# Patient Record
Sex: Female | Born: 1995 | Race: Black or African American | Hispanic: No | Marital: Single | Smoking: Never smoker
Health system: Southern US, Community
[De-identification: ages and names within clinical notes are randomized; demographics above are authoritative.]

## PROBLEM LIST (undated history)

## (undated) ENCOUNTER — Ambulatory Visit: Payer: Self-pay

## (undated) ENCOUNTER — Emergency Department (HOSPITAL_COMMUNITY): Admission: EM | Payer: Medicaid Other | Source: Home / Self Care

## (undated) DIAGNOSIS — F32A Depression, unspecified: Secondary | ICD-10-CM

## (undated) DIAGNOSIS — F419 Anxiety disorder, unspecified: Secondary | ICD-10-CM

## (undated) DIAGNOSIS — L503 Dermatographic urticaria: Secondary | ICD-10-CM

## (undated) DIAGNOSIS — N926 Irregular menstruation, unspecified: Secondary | ICD-10-CM

## (undated) DIAGNOSIS — D509 Iron deficiency anemia, unspecified: Secondary | ICD-10-CM

## (undated) DIAGNOSIS — L509 Urticaria, unspecified: Secondary | ICD-10-CM

## (undated) HISTORY — PX: FINGER SURGERY: SHX640

## (undated) HISTORY — PX: WISDOM TOOTH EXTRACTION: SHX21

---
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
## 2018-04-10 ENCOUNTER — Other Ambulatory Visit (HOSPITAL_COMMUNITY)
Admission: RE | Admit: 2018-04-10 | Discharge: 2018-04-10 | Disposition: A | Discharge status: Home / Self Care | Payer: BLUE CROSS/BLUE SHIELD | Source: Ambulatory Visit | Attending: Family Medicine | Admitting: Family Medicine

## 2018-04-10 ENCOUNTER — Other Ambulatory Visit: Payer: Self-pay | Admitting: *Deleted

## 2018-04-10 ENCOUNTER — Other Ambulatory Visit: Payer: Self-pay

## 2018-04-10 ENCOUNTER — Ambulatory Visit (INDEPENDENT_AMBULATORY_CARE_PROVIDER_SITE_OTHER): Payer: BLUE CROSS/BLUE SHIELD | Admitting: Family Medicine

## 2018-04-10 ENCOUNTER — Encounter: Payer: Self-pay | Admitting: Family Medicine

## 2018-04-10 VITALS — BP 112/64 | HR 91 | Temp 99.1°F | Resp 16 | Ht 66.0 in | Wt 217.8 lb

## 2018-04-10 DIAGNOSIS — Z0000 Encounter for general adult medical examination without abnormal findings: Secondary | ICD-10-CM | POA: Diagnosis not present

## 2018-04-10 DIAGNOSIS — N926 Irregular menstruation, unspecified: Secondary | ICD-10-CM

## 2018-04-10 DIAGNOSIS — E669 Obesity, unspecified: Secondary | ICD-10-CM

## 2018-04-10 DIAGNOSIS — Z113 Encounter for screening for infections with a predominantly sexual mode of transmission: Secondary | ICD-10-CM | POA: Diagnosis not present

## 2018-04-10 DIAGNOSIS — L700 Acne vulgaris: Secondary | ICD-10-CM

## 2018-04-10 LAB — COMPREHENSIVE METABOLIC PANEL
ALBUMIN: 3.9 g/dL (ref 3.5–5.2)
ALT: 12 U/L (ref 0–35)
AST: 16 U/L (ref 0–37)
Alkaline Phosphatase: 38 U/L — ABNORMAL LOW (ref 39–117)
BILIRUBIN TOTAL: 0.3 mg/dL (ref 0.2–1.2)
BUN: 9 mg/dL (ref 6–23)
CO2: 25 meq/L (ref 19–32)
CREATININE: 0.78 mg/dL (ref 0.40–1.20)
Calcium: 8.7 mg/dL (ref 8.4–10.5)
Chloride: 108 mEq/L (ref 96–112)
GFR: 113.01 mL/min (ref >60.00)
Glucose, Bld: 86 mg/dL (ref 70–99)
Potassium: 3.8 mEq/L (ref 3.5–5.1)
SODIUM: 141 meq/L (ref 135–145)
Total Protein: 5.9 g/dL — ABNORMAL LOW (ref 6.0–8.3)

## 2018-04-10 LAB — CBC WITH DIFFERENTIAL/PLATELET
BASOS ABS: 0 10*3/uL (ref 0.0–0.1)
BASOS PCT: 0.8 % (ref 0.0–3.0)
EOS ABS: 0.1 10*3/uL (ref 0.0–0.7)
EOS PCT: 1.2 % (ref 0.0–5.0)
HEMATOCRIT: 34.5 % — AB (ref 36.0–46.0)
HEMOGLOBIN: 11.4 g/dL — AB (ref 12.0–15.0)
Lymphocytes Relative: 44.2 % (ref 12.0–46.0)
Lymphs Abs: 2.2 10*3/uL (ref 0.7–4.0)
MCHC: 33 g/dL (ref 30.0–36.0)
MCV: 89.3 fl (ref 78.0–100.0)
MONO ABS: 0.3 10*3/uL (ref 0.1–1.0)
Monocytes Relative: 5.7 % (ref 3.0–12.0)
Neutro Abs: 2.4 10*3/uL (ref 1.4–7.7)
Neutrophils Relative %: 48.1 % (ref 43.0–77.0)
Platelets: 295 10*3/uL (ref 150.0–400.0)
RBC: 3.86 Mil/uL — ABNORMAL LOW (ref 3.87–5.11)
RDW: 12.3 % (ref 11.5–14.6)
WBC: 5 10*3/uL (ref 4.5–10.5)

## 2018-04-10 LAB — LIPID PANEL
CHOL/HDL RATIO: 3
Cholesterol: 140 mg/dL (ref 0–200)
HDL: 48.8 mg/dL (ref >39.00)
LDL CALC: 77 mg/dL (ref 0–99)
NonHDL: 91.32
Triglycerides: 74 mg/dL (ref 0.0–149.0)
VLDL: 14.8 mg/dL (ref 0.0–40.0)

## 2018-04-10 LAB — TSH: TSH: 1.69 u[IU]/mL (ref 0.35–5.50)

## 2018-04-10 MED ORDER — ADAPALENE 0.1 % EX CREA: TOPICAL_CREAM | Freq: Every day | CUTANEOUS | 5 refills | Status: DC

## 2018-04-10 NOTE — Progress Notes (Signed)
Subjective  CC:  Chief Complaint  Patient presents with  . Establish Care    She fasting  . Hand Pain    Left hand pinky finger    HPI: Anna Potts is a 23 y.o. female is a former [facility] patient and is here to reestablish care with me today. And for wellness exam   She has the following concerns or needs:  23 yo female who lives with mom and younger sister who currently at [facility].  She is in a monogamous same-sex relationship for the last 8 months.  She is only been with a female partner 1 time in her life.  She has had greater than 5 sexual partners.  She requests STI testing today.  She is asymptomatic.  Irregular menses since young adolescent.  Continues to have irregular periods, typically 1 every other month or so.  No longer with painful menstrual cycles.  She does take iron due to history of anemia.  She admits to mild hirsutism and mild acne.  Obesity: Weight goes up and down.  Tends to eat an unhealthy diet.  Exercises intermittent.  She denies symptoms of hyperglycemia.  Acne: Had been on Differin cream in the past that worked well.  Assessment  1. Annual physical exam   2. Screen for STD (sexually transmitted disease)   3. Irregular menses   4. Acne vulgaris   5. Obesity (BMI 30-39.9)      Plan  Female Wellness Visit:  Age appropriate Health Maintenance and Prevention measures were discussed with patient. Included topics are cancer screening recommendations, ways to keep healthy (see AVS) including dietary and exercise recommendations, regular eye and dental care, use of seat belts, and avoidance of moderate alcohol use and tobacco use.   BMI: discussed patient's BMI and encouraged positive lifestyle modifications to help get to or maintain a target BMI.  HM needs and immunizations were addressed and ordered. See below for orders. See HM and immunization section for updates.  Routine labs and screening tests ordered including cmp, cbc and lipids where  appropriate.  Discussed recommendations regarding Vit D and calcium supplementation (see AVS)  STI screening education done and ordered.  Discussed safe sex.  Irregular menses with hirsutism and acne: Start work-up for PCOS.  Check ultrasound and lab work.  Acne: Start Differin     Follow up:  Return in about 1 year (around 04/11/2019) for complete physical.  Orders Placed This Encounter  Procedures  . US Pelvic Complete With Transvaginal  . CBC with Differential/Platelet  . Comprehensive metabolic panel  . Lipid panel  . HIV Antibody (routine testing w rflx)  . TSH  . FSH/LH   Meds ordered this encounter  Medications  . adapalene (DIFFERIN) 0.1 % cream    Sig: Apply topically at bedtime.    Dispense:  45 g    Refill:  5      We updated and reviewed the patient's past history in detail and it is documented below.  Patient Active Problem List   Diagnosis Date Noted  . Obesity (BMI 30-39.9) 04/10/2018  . Dysfunctional uterine bleeding 11/02/2012    Overview:  Managed with OCPs.   . Acne 12/27/2011    Overview:  Face only; closed comedones   . 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 HIV Screening  06/16/2012  . INFLUENZA VACCINE  09/25/2017  . TETANUS/TDAP  10/18/2018   Immunization  History  Administered Date(s) Administered  . DTaP 08/18/1997, 10/17/1997, 12/28/1997, 01/09/1999, 06/17/2001  . HPV 9-valent 12/07/2013  . Hepatitis A, Ped/Adol-2 Dose 09/25/2005, 09/26/2006  . Hepatitis B, ped/adol 04/06/1997, 08/18/1997, 06/28/1998  . HiB (PRP-OMP) 10/17/1997, 09/25/1998, 01/09/1999  . Influenza, Seasonal, Injecte, Preservative Fre 12/07/2013  . MMR 09/25/1998, 06/17/2001  . Meningococcal Conjugate 01/06/2012, 10/05/2013  . OPV 08/18/1997, 10/17/1997, 01/09/1999, 06/17/2001  . Pneumococcal-Unspecified 01/09/1999, 06/23/1999  . Td 10/17/2008  . Tdap 10/17/2008  . Varicella 06/17/2001, 10/31/2009    No outpatient medications have been marked as taking for the 04/10/18 encounter (Office Visit) with Leamon Arnt, MD.    Allergies: Patient is allergic to codeine; grassleaf sweetflag rhizome; and nickel. Past Medical History Patient  has no past medical history on file. Past Surgical History Patient  has a past surgical history that includes Wisdom tooth extraction. Family History: Patient family history is not on file. Social History:  Patient  reports that she has never smoked. She has never used smokeless tobacco. She reports current drug use. Drug: Marijuana. She reports that she does not drink alcohol.  Review of Systems: Constitutional: negative for fever or malaise Ophthalmic: negative for photophobia, double vision or loss of vision Cardiovascular: negative for chest pain, dyspnea on exertion, or new LE swelling Respiratory: negative for SOB or persistent cough Gastrointestinal: negative for abdominal pain, change in bowel habits or melena Genitourinary: negative for dysuria or gross hematuria Musculoskeletal: negative for new gait disturbance or muscular weakness Integumentary: negative for new or persistent rashes Neurological: negative for TIA or stroke symptoms Psychiatric: negative for SI or delusions Allergic/Immunologic: negative for hives  Patient Care Team    Relationship Specialty Notifications Start End  Leamon Arnt, MD PCP - General Family Medicine  04/10/18     Objective  Vitals: BP 112/64   Pulse 91   Temp 99.1 F (37.3 C) (Oral)   Resp 16   Ht '5\' 6"'  (1.676 m)   Wt 217 lb 12.8 oz (98.8 kg)   LMP 04/08/2018   SpO2 98%   BMI 35.15 kg/m  General:  Well developed, well nourished, no acute distress  Psych:  Alert and oriented,normal mood and affect HEENT:  Normocephalic, atraumatic, non-icteric sclera, PERRL, oropharynx is without mass or exudate, supple neck without adenopathy, mass or thyromegaly Cardiovascular:  RRR without gallop, rub or  murmur, nondisplaced PMI Respiratory:  Good breath sounds bilaterally, CTAB with normal respiratory effort Gastrointestinal: normal bowel sounds, soft, non-tender, no noted masses. No HSM MSK: no deformities, contusions. Joints are without erythema or swelling Skin:  Warm, no rashes or suspicious lesions noted, mild acne on forehead and T-zone.  Mild hirsutism lower abdomen Neurologic:    Mental status is normal. Gross motor and sensory exams are normal. Normal gait  Commons side effects, risks, benefits, and alternatives for medications and treatment plan prescribed today were discussed, and the patient expressed understanding of the given instructions. Patient is instructed to call or message via MyChart if he/she has any questions or concerns regarding our treatment plan. No barriers to understanding were identified. We discussed Red Flag symptoms and signs in detail. Patient expressed understanding regarding what to do in case of urgent or emergency type symptoms.   Medication list was reconciled, printed and provided to the patient in AVS. Patient instructions and summary information was reviewed with the patient as documented in the AVS. This note was prepared with assistance of Dragon voice recognition software. Occasional wrong-word or sound-a-like substitutions may have occurred  due to the inherent limitations of voice recognition software

## 2018-04-10 NOTE — Patient Instructions (Addendum)
It was so good seeing you again! Thank you for establishing with my new practice and allowing me to continue caring for you. It means a lot to me.   I will release your lab results to you on your MyChart account with further instructions. Please reply with any questions.   We will set up an ultrasound to look at your ovaries and check blood work to see if you have PCOS as we discussed.   Please schedule a follow up appointment with me in 12 months for your annual complete physical; please come fasting.   Fat and Cholesterol Restricted Eating Plan Getting too much fat and cholesterol in your diet may cause health problems. Choosing the right foods helps keep your fat and cholesterol at normal levels. This can keep you from getting certain diseases. Your doctor may recommend an eating plan that includes:  Total fat: ______% or less of total calories a day.  Saturated fat: ______% or less of total calories a day.  Cholesterol: less than _________mg a day.  Fiber: ______g a day. What are tips for following this plan? Meal planning  At meals, divide your plate into four equal parts: ? Fill one-half of your plate with vegetables and green salads. ? Fill one-fourth of your plate with whole grains. ? Fill one-fourth of your plate with low-fat (lean) protein foods.  Eat fish that is high in omega-3 fats at least two times a week. This includes mackerel, tuna, sardines, and salmon.  Eat foods that are high in fiber, such as whole grains, beans, apples, broccoli, carrots, peas, and barley. General tips   Work with your doctor to lose weight if you need to.  Avoid: ? Foods with added sugar. ? Fried foods. ? Foods with partially hydrogenated oils.  Limit alcohol intake to no more than 1 drink a day for nonpregnant women and 2 drinks a day for men. One drink equals 12 oz of beer, 5 oz of wine, or 1 oz of hard liquor. Reading food labels  Check food labels for: ? Trans  fats. ? Partially hydrogenated oils. ? Saturated fat (g) in each serving. ? Cholesterol (mg) in each serving. ? Fiber (g) in each serving.  Choose foods with healthy fats, such as: ? Monounsaturated fats. ? Polyunsaturated fats. ? Omega-3 fats.  Choose grain products that have whole grains. Look for the word "whole" as the first word in the ingredient list. Cooking  Cook foods using low-fat methods. These include baking, boiling, grilling, and broiling.  Eat more home-cooked foods. Eat at restaurants and buffets less often.  Avoid cooking using saturated fats, such as butter, cream, palm oil, palm kernel oil, and coconut oil. Recommended foods  Fruits  All fresh, canned (in natural juice), or frozen fruits. Vegetables  Fresh or frozen vegetables (raw, steamed, roasted, or grilled). Green salads. Grains  Whole grains, such as whole wheat or whole grain breads, crackers, cereals, and pasta. Unsweetened oatmeal, bulgur, barley, quinoa, or Warning rice. Corn or whole wheat flour tortillas. Meats and other protein foods  Ground beef (85% or leaner), grass-fed beef, or beef trimmed of fat. Skinless chicken or [place]. Ground chicken or [place]. Pork trimmed of fat. All fish and seafood. Egg whites. Dried beans, peas, or lentils. Unsalted nuts or seeds. Unsalted canned beans. Nut butters without added sugar or oil. Dairy  Low-fat or nonfat dairy products, such as skim or 1% milk, 2% or reduced-fat cheeses, low-fat and fat-free ricotta or cottage cheese, or plain low-fat  and nonfat yogurt. Fats and oils  Tub margarine without trans fats. Light or reduced-fat mayonnaise and salad dressings. Avocado. Olive, canola, sesame, or safflower oils. The items listed above may not be a complete list of foods and beverages you can eat. Contact a dietitian for more information. Foods to avoid Fruits  Canned fruit in heavy syrup. Fruit in cream or butter sauce. Fried fruit. Vegetables  Vegetables  cooked in cheese, cream, or butter sauce. Fried vegetables. Grains  White bread. White pasta. White rice. Cornbread. Bagels, pastries, and croissants. Crackers and snack foods that contain trans fat and hydrogenated oils. Meats and other protein foods  Fatty cuts of meat. Ribs, chicken wings, bacon, sausage, bologna, salami, chitterlings, fatback, hot dogs, bratwurst, and packaged lunch meats. Liver and organ meats. Whole eggs and egg yolks. Chicken and [place] with skin. Fried meat. Dairy  Whole or 2% milk, cream, half-and-half, and cream cheese. Whole milk cheeses. Whole-fat or sweetened yogurt. Full-fat cheeses. Nondairy creamers and whipped toppings. Processed cheese, cheese spreads, and cheese curds. Beverages  Alcohol. Sugar-sweetened drinks such as sodas, lemonade, and fruit drinks. Fats and oils  Butter, stick margarine, lard, shortening, ghee, or bacon fat. Coconut, palm kernel, and palm oils. Sweets and desserts  Corn syrup, sugars, honey, and molasses. Candy. Jam and jelly. Syrup. Sweetened cereals. Cookies, pies, cakes, donuts, muffins, and ice cream. The items listed above may not be a complete list of foods and beverages you should avoid. Contact a dietitian for more information. Summary  Choosing the right foods helps keep your fat and cholesterol at normal levels. This can keep you from getting certain diseases.  At meals, fill one-half of your plate with vegetables and green salads.  Eat high-fiber foods, like whole grains, beans, apples, carrots, peas, and barley.  Limit added sugar, saturated fats, alcohol, and fried foods. This information is not intended to replace advice given to you by your health care provider. Make sure you discuss any questions you have with your health care provider. Document Released: 08/13/2011 Document Revised: 10/15/2017 Document Reviewed: 10/29/2016 Elsevier Interactive Patient Education  2019 [employer].   Please do these things to  maintain good health!   Exercise at least 30-45 minutes a day,  4-5 days a week.   Eat a low-fat diet with lots of fruits and vegetables, up to 7-9 servings per day.  Drink plenty of water daily. Try to drink 8 8oz glasses per day.  Seatbelts can save your life. Always wear your seatbelt.  Place Smoke Detectors on every level of your home and check batteries every year.  Schedule an appointment with an eye doctor for an eye exam every 1-2 years  Safe sex - use condoms to protect yourself from STDs if you could be exposed to these types of infections. Use birth control if you do not want to become pregnant and are sexually active.  Avoid heavy alcohol use. If you drink, keep it to less than 2 drinks/day and not every day.  Health Care Power of Attorney.  Choose someone you trust that could speak for you if you became unable to speak for yourself.  Depression is common in our stressful world.If you're feeling down or losing interest in things you normally enjoy, please come in for a visit.  If anyone is threatening or hurting you, please get help. Physical or Emotional Violence is never OK.

## 2018-04-11 LAB — FSH/LH
FSH: 6.2 m[IU]/mL
LH: 2.1 m[IU]/mL

## 2018-04-11 LAB — HIV ANTIBODY (ROUTINE TESTING W REFLEX): HIV: NONREACTIVE

## 2018-04-13 LAB — URINE CYTOLOGY ANCILLARY ONLY
Chlamydia: NEGATIVE
NEISSERIA GONORRHEA: NEGATIVE

## 2018-07-17 ENCOUNTER — Ambulatory Visit (INDEPENDENT_AMBULATORY_CARE_PROVIDER_SITE_OTHER): Payer: BLUE CROSS/BLUE SHIELD | Admitting: Family Medicine

## 2018-07-17 ENCOUNTER — Encounter: Payer: Self-pay | Admitting: Family Medicine

## 2018-07-17 ENCOUNTER — Other Ambulatory Visit: Payer: Self-pay

## 2018-07-17 ENCOUNTER — Ambulatory Visit: Payer: Self-pay | Admitting: *Deleted

## 2018-07-17 ENCOUNTER — Encounter: Payer: BLUE CROSS/BLUE SHIELD | Admitting: Family Medicine

## 2018-07-17 VITALS — Temp 98.7°F

## 2018-07-17 DIAGNOSIS — L509 Urticaria, unspecified: Secondary | ICD-10-CM

## 2018-07-17 NOTE — Progress Notes (Signed)
     Virtual Visit via Video Note  Subjective  CC:  Chief Complaint  Patient presents with  . Rash/Hives    Started about a month, comes and goes.. She states that it is in varies areas.. She uses benadryl and itch cream with minimal relief.. She can not tell what triggers and has made changes to soap     I connected with Jefm Petty on 07/17/18 at  3:40 PM EDT by a video enabled telemedicine application and verified that I am speaking with the correct person using two identifiers. Location patient: Home Location provider: [facility] [address] Office Persons participating in the virtual visit: Anna Potts, Willow Ora, MD Rita Ohara, CMA  I discussed the limitations of evaluation and management by telemedicine and the availability of in person appointments. The patient expressed understanding and agreed to proceed. HPI: Anna Potts is a 23 y.o. female who was contacted today to address the problems listed above in the chief complaint. . Happy and healthy but has hives; started about a month ago ... come and go but typically present almost daily now. No other sxs. No angioedema. No blisters. + itching. No f/c/s, ST or GI sxs. No h/o allergies or hives. No SOB.  Assessment  1. Urticaria      Plan   urticaria:  Hard to see on video; will treat with zyrtec and pepcid; if not improving in one week, OV in office for better look.  I discussed the assessment and treatment plan with the patient. The patient was provided an opportunity to ask questions and all were answered. The patient agreed with the plan and demonstrated an understanding of the instructions.   The patient was advised to call back or seek an in-person evaluation if the symptoms worsen or if the condition fails to improve as anticipated. Follow up: No follow-ups on file.  Visit date not found  No orders of the defined types were placed in this encounter.     I reviewed the patients updated  PMH, FH, and SocHx.    Patient Active Problem List   Diagnosis Date Noted  . Obesity (BMI 30-39.9) 04/10/2018  . 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 Acne 12/27/2011  . 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 adapalene (DIFFERIN) 0.1 % cream Apply topically at bedtime.  . ferrous sulfate (FERROUSUL) 325 (65 FE) MG tablet Take 1 tablet (325 mg total) by mouth 2 (two) times daily.    Allergies: Patient is allergic to codeine; grassleaf sweetflag rhizome; and nickel. Family History: Patient family history is not on file. Social History:  Patient  reports that she has never smoked. She has never used smokeless tobacco. She reports current drug use. Drug: Marijuana. She reports that she does not drink alcohol.  Review of Systems: Constitutional: Negative for fever malaise or anorexia Cardiovascular: negative for chest pain Respiratory: negative for SOB or persistent cough Gastrointestinal: negative for abdominal pain  OBJECTIVE Vitals: Temp 98.7 F (37.1 C) (Oral)   LMP  (Within Months)  General: no acute distress , A&Ox3 Possible pointing to hives on legs. Hard to tell. No flaking or blistering or papules visualized Willow Ora, MD

## 2018-07-17 NOTE — Patient Instructions (Addendum)
Start using generic pepcid and zyrtec daily and then come to the office if it is not improving.  Hives Hives (urticaria) are itchy, red, swollen areas on the skin. Hives can appear on any part of the body. Hives often fade within 24 hours (acute hives). Sometimes, new hives appear after old ones fade and the cycle can continue for several days or weeks (chronic hives). Hives do not spread from person to person (are not contagious). Hives come from the body's reaction to something a person is allergic to (allergen), something that causes irritation, or various other triggers. When a person is exposed to a trigger, his or her body releases a chemical (histamine) that causes redness, itching, and swelling. Hives can appear right after exposure to a trigger or hours later. What are the causes? This condition may be caused by:  Allergies to foods or ingredients.  Insect bites or stings.  Exposure to pollen or pets.  Contact with latex or chemicals.  Spending time in sunlight, heat, or cold (exposure).  Exercise.  Stress.  Certain medicines. You can also get hives from other medical conditions and treatments, such as:  Viruses, including the common cold.  Bacterial infections, such as urinary tract infections and strep throat.  Certain medicines.  Allergy shots.  Blood transfusions. Sometimes, the cause of this condition is not known (idiopathic hives). What increases the risk? You are more likely to develop this condition if you:  Are a woman.  Have food allergies, especially to citrus fruits, milk, eggs, peanuts, tree nuts, or shellfish.  Are allergic to: ? Medicines. ? Latex. ? Insects. ? Animals. ? Pollen. What are the signs or symptoms? Common symptoms of this condition include raised, itchy, red or white bumps or patches on your skin. These areas may:  Become large and swollen (welts).  Change in shape and location, quickly and repeatedly.  Be separate hives or  connect over a large area of skin.  Sting or become painful.  Turn white when pressed in the center (blanch). In severe cases, yourhands, feet, and face may also become swollen. This may occur if hives develop deeper in your skin. How is this diagnosed? This condition may be diagnosed by your symptoms, medical history, and physical exam.  Your skin, urine, or blood may be tested to find out what is causing your hives and to rule out other health issues.  Your health care provider may also remove a small sample of skin from the affected area and examine it under a microscope (biopsy). How is this treated? Treatment for this condition depends on the cause and severity of your symptoms. Your health care provider may recommend using cool, wet cloths (cool compresses) or taking cool showers to relieve itching. Treatment may include:  Medicines that help: ? Relieve itching (antihistamines). ? Reduce swelling (corticosteroids). ? Treat infection (antibiotics).  An injectable medicine (omalizumab). Your health care provider may prescribe this if you have chronic idiopathic hives and you continue to have symptoms even after treatment with antihistamines. Severe cases may require an emergency injection of adrenaline (epinephrine) to prevent a life-threatening allergic reaction (anaphylaxis). Follow these instructions at home: Medicines  Take and apply over-the-counter and prescription medicines only as told by your health care provider.  If you were prescribed an antibiotic medicine, take it as told by your health care provider. Do not stop using the antibiotic even if you start to feel better. Skin care  Apply cool compresses to the affected areas.  Do not  scratch or rub your skin. General instructions  Do not take hot showers or baths. This can make itching worse.  Do not wear tight-fitting clothing.  Use sunscreen and wear protective clothing when you are outside.  Avoid any  substances that cause your hives. Keep a journal to help track what causes your hives. Write down: ? What medicines you take. ? What you eat and drink. ? What products you use on your skin.  Keep all follow-up visits as told by your health care provider. This is important. Contact a health care provider if:  Your symptoms are not controlled with medicine.  Your joints are painful or swollen. Get help right away if:  You have a fever.  You have pain in your abdomen.  Your tongue or lips are swollen.  Your eyelids are swollen.  Your chest or throat feels tight.  You have trouble breathing or swallowing. These symptoms may represent a serious problem that is an emergency. Do not wait to see if the symptoms will go away. Get medical help right away. Call your local emergency services (911 in the [place] Do not drive yourself to the hospital. Summary  Hives (urticaria) are itchy, red, swollen areas on your skin. Hives come from the body's reaction to something a person is allergic to (allergen), something that causes irritation, or various other triggers.  Treatment for this condition depends on the cause and severity of your symptoms.  Avoid any substances that cause your hives. Keep a journal to help track what causes your hives.  Take and apply over-the-counter and prescription medicines only as told by your health care provider.  Keep all follow-up visits as told by your health care provider. This is important. This information is not intended to replace advice given to you by your health care provider. Make sure you discuss any questions you have with your health care provider. Document Released: 02/11/2005 Document Revised: 08/27/2017 Document Reviewed: 08/27/2017 Elsevier Interactive Patient Education  2019 [employer].

## 2018-07-17 NOTE — Telephone Encounter (Signed)
Please call to schedule appointment.

## 2018-07-17 NOTE — Telephone Encounter (Signed)
Patient states she started with hives on her back about 1 month ago- patient states she has been using over the counter treatment: Benadryl and creams- but the hives have spread to all over the body.  Patient states the hives will go away and come back- she states the itching is bad- hives may go away with shower or treatment- but them they return. Call to office for appointment.  Reason for Disposition . [1] MODERATE-SEVERE hives persist (i.e., hives interfere with normal activities or work) AND [2] taking antihistamine (e.g., Benadryl, Claritin) > 24 hours  Answer Assessment - Initial Assessment Questions 1. APPEARANCE: "What does the rash look like?"      Red, raised, small bumps 2. LOCATION: "Where is the rash located?"      Back, chest,arms, legs 3. NUMBER: "How many hives are there?"      Blocks- comes and goes- can't count 4. SIZE: "How big are the hives?" (inches, cm, compare to coins) "Do they all look the same or is there lots of variation in shape and size?"      Large areas- look the same- many be different sizes depending on area of body they are on 5. ONSET: "When did the hives begin?" (Hours or days ago)      1 month 6. ITCHING: "Does it itch?" If so, ask: "How bad is the itch?"    - MILD: doesn't interfere with normal activities   - MODERATE - SEVERE: interferes with work, school, sleep, or other activities      severe 7. RECURRENT PROBLEM: "Have you had hives before?" If so, ask: "When was the last time?" and "What happened that time?"      no 8. TRIGGERS: "Were you exposed to any new food, plant, cosmetic product or animal just before the hives began?"     Patient has been trying to figure this out- but she can't figure it out 9. OTHER SYMPTOMS: "Do you have any other symptoms?" (e.g., fever, tongue swelling, difficulty breathing, abdominal pain)     no 10. PREGNANCY: "Is there any chance you are pregnant?" "When was your last menstrual period?"       No- LMP-  irregular- not active with female  Protocols used: HIVES-A-AH

## 2018-07-17 NOTE — Telephone Encounter (Signed)
Patient been scheduled for 07/17/18 @ 1pm.

## 2018-07-29 ENCOUNTER — Other Ambulatory Visit: Payer: Self-pay

## 2018-07-29 ENCOUNTER — Ambulatory Visit (INDEPENDENT_AMBULATORY_CARE_PROVIDER_SITE_OTHER): Payer: BLUE CROSS/BLUE SHIELD | Admitting: Family Medicine

## 2018-07-29 ENCOUNTER — Encounter: Payer: Self-pay | Admitting: Family Medicine

## 2018-07-29 VITALS — BP 120/65 | HR 73 | Temp 98.0°F | Ht 65.0 in | Wt 228.0 lb

## 2018-07-29 DIAGNOSIS — L503 Dermatographic urticaria: Secondary | ICD-10-CM

## 2018-07-29 MED ORDER — HYDROXYZINE HCL 25 MG PO TABS: 25.0000 mg | ORAL_TABLET | Freq: Three times a day (TID) | ORAL | 2 refills | Status: DC | PRN

## 2018-07-29 MED ORDER — FAMOTIDINE 20 MG PO TABS: 20.0000 mg | ORAL_TABLET | Freq: Two times a day (BID) | ORAL | 5 refills | Status: DC

## 2018-07-29 MED ORDER — CETIRIZINE HCL 10 MG PO TABS: 10.0000 mg | ORAL_TABLET | Freq: Every day | ORAL | 11 refills | Status: DC

## 2018-07-29 NOTE — Patient Instructions (Addendum)
Please return in February for your physical and pap smear.   If you have any questions or concerns, please don't hesitate to send me a message via MyChart or call the office at [phone]. Thank you for visiting with [place] today! It's our pleasure caring for you.  Look up dermatographism on WebMD

## 2018-07-29 NOTE — Progress Notes (Signed)
Subjective  CC:  Chief Complaint  Patient presents with  . Rash    body     HPI: Anna Potts is a 23 y.o. female who presents to the office today to address the problems listed above in the chief complaint.  See last virtual visit. Started zyrtec. Reports any times she scratches or rubs her skin, she gets "hives". Then she itches. They then disappear. No other associated sxs.   Assessment  1. 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 rec zyrtec, hydroxyzine and pepcid bid. Should start to improve over the next 2-4 weeks. She will let me know if she needs more help. Avoid scratching and rubbing skin. No tattooing now.   Follow up: Return in about 8 months (around 03/31/2019) for complete physical.  Visit date not found  No orders of the defined types were placed in this encounter.  Meds ordered this encounter  Medications  . famotidine (PEPCID) 20 MG tablet    Sig: Take 1 tablet (20 mg total) by mouth 2 (two) times daily.    Dispense:  60 tablet    Refill:  5  . hydrOXYzine (ATARAX/VISTARIL) 25 MG tablet    Sig: Take 1 tablet (25 mg total) by mouth 3 (three) times daily as needed.    Dispense:  60 tablet    Refill:  2  . cetirizine (ZYRTEC) 10 MG tablet    Sig: Take 1 tablet (10 mg total) by mouth daily.    Dispense:  30 tablet    Refill:  11      I reviewed the patients updated PMH, FH, and SocHx.    Patient Active Problem List   Diagnosis Date Noted  . Obesity (BMI 30-39.9) 04/10/2018  . 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 Acne 12/27/2011  . 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    Allergies: Patient is allergic to codeine; grassleaf sweetflag rhizome; and nickel. Family History: Patient family history is not on file. Social History:  Patient  reports that she has never smoked. She has  never used smokeless tobacco. She reports current drug use. Drug: Marijuana. She reports that she does not drink alcohol.  Review of Systems: Constitutional: Negative for fever malaise or anorexia Cardiovascular: negative for chest pain Respiratory: negative for SOB or persistent cough Gastrointestinal: negative for abdominal pain  Objective  Vitals: BP 120/65 (BP Location: Right Arm, Patient Position: Sitting, Cuff Size: Normal)   Pulse 73   Temp 98 F (36.7 C)   Ht 5\' 5"  (1.651 m)   Wt 228 lb (103.4 kg)   LMP 04/28/2018 (Exact Date)   SpO2 98%   BMI 37.94 kg/m  General: no acute distress , A&Ox3 Skin:  Warm, bilateral forearms: linear scratch with rapid appearance of wheel and flare reaction     Commons side effects, risks, benefits, and alternatives for medications and treatment plan prescribed today were discussed, and the patient expressed understanding of the given instructions. Patient is instructed to call or message via MyChart if he/she has any questions or concerns regarding our treatment plan. No barriers to understanding were identified. We discussed Red Flag symptoms and signs in detail. Patient expressed understanding regarding what to do in case of urgent or emergency type symptoms.   Medication list was reconciled, printed and provided to the patient in AVS. Patient instructions  and summary information was reviewed with the patient as documented in the AVS. This note was prepared with assistance of Dragon voice recognition software. Occasional wrong-word or sound-a-like substitutions may have occurred due to the inherent limitations of voice recognition software

## 2019-01-29 ENCOUNTER — Other Ambulatory Visit: Payer: Self-pay

## 2019-01-29 ENCOUNTER — Encounter (HOSPITAL_COMMUNITY): Payer: Self-pay | Admitting: *Deleted

## 2019-01-29 ENCOUNTER — Emergency Department (HOSPITAL_COMMUNITY)
Admission: EM | Admit: 2019-01-29 | Discharge: 2019-01-29 | Disposition: A | Discharge status: Home / Self Care | Payer: BC Managed Care – PPO | Attending: Emergency Medicine | Admitting: Emergency Medicine

## 2019-01-29 DIAGNOSIS — Y999 Unspecified external cause status: Secondary | ICD-10-CM | POA: Insufficient documentation

## 2019-01-29 DIAGNOSIS — Z79899 Other long term (current) drug therapy: Secondary | ICD-10-CM | POA: Diagnosis not present

## 2019-01-29 DIAGNOSIS — W260XXA Contact with knife, initial encounter: Secondary | ICD-10-CM | POA: Diagnosis not present

## 2019-01-29 DIAGNOSIS — Z23 Encounter for immunization: Secondary | ICD-10-CM | POA: Insufficient documentation

## 2019-01-29 DIAGNOSIS — Y9389 Activity, other specified: Secondary | ICD-10-CM | POA: Diagnosis not present

## 2019-01-29 DIAGNOSIS — Y92810 Car as the place of occurrence of the external cause: Secondary | ICD-10-CM | POA: Diagnosis not present

## 2019-01-29 DIAGNOSIS — S81811A Laceration without foreign body, right lower leg, initial encounter: Secondary | ICD-10-CM

## 2019-01-29 MED ORDER — LIDOCAINE-EPINEPHRINE (PF) 2 %-1:200000 IJ SOLN
10.0000 mL | Freq: Once | INTRAMUSCULAR | Status: AC
  Administered 2019-01-29: 10 mL
  Filled 2019-01-29: qty 20

## 2019-01-29 MED ORDER — HYDROXYZINE HCL 25 MG PO TABS
25.0000 mg | ORAL_TABLET | Freq: Once | ORAL | Status: AC
  Administered 2019-01-29: 25 mg via ORAL
  Filled 2019-01-29: qty 1

## 2019-01-29 MED ORDER — TETANUS-DIPHTH-ACELL PERTUSSIS 5-2.5-18.5 LF-MCG/0.5 IM SUSP
0.5000 mL | Freq: Once | INTRAMUSCULAR | Status: AC
  Administered 2019-01-29: 0.5 mL via INTRAMUSCULAR
  Filled 2019-01-29: qty 0.5

## 2019-01-29 NOTE — ED Triage Notes (Signed)
Pt with laceration to the right anterior thigh, says she was using a box cutter and cut her leg while at work. Unknown last tetanus shot

## 2019-01-29 NOTE — Discharge Instructions (Signed)
1. Medications: Tylenol or ibuprofen for pain 2. Treatment: ice for swelling, keep wound clean with warm soap and water and keep bandage dry, do not submerge in water for 24 hours 3. Follow Up: Please return in 10 days to have your stitches removed or sooner if you have concerns. Return to the emergency department for increased redness, drainage of pus from the wound   WOUND CARE  Remove bandage and wash wound gently with mild soap and warm water daily. Reapply a new bandage after cleaning wound.   Continue daily cleansing with soap and water until stitches are removed.  Do not apply any ointments or creams to the wound while stitches are in place, as this may cause delayed healing. Return if you experience any of the following signs of infection: Swelling, redness, pus drainage, streaking, fever >101.0 F  Return if you experience excessive bleeding that does not stop after 15-20 minutes of constant, firm pressure.

## 2019-01-29 NOTE — ED Notes (Signed)
Pt has 1 inch lac to right upper anterior thigh that occurred when using a box cutter last night. Accidental.

## 2019-01-29 NOTE — ED Provider Notes (Signed)
[facility] Provider Note   CSN: [id number] Arrival date & time: 01/29/19  0449     History   Chief Complaint Chief Complaint  Patient presents with  . Extremity Laceration    HPI Anna Potts is a 23 y.o. female presenting for evaluation of right leg laceration.  Patient states around 330 this morning she was opening something in her car with a box cutter when her hand slipped, she caught her anterior right thigh.  Patient reports a mild discomfort at the site of the laceration, but no pain elsewhere.  She denies injury elsewhere.  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  She is not on blood thinners.  Her tetanus shot is not up-to-date.  She denies numbness or tingling.  She has been able to ambulate with mild discomfort, but no weakness.     HPI  History reviewed. No pertinent past medical history.  Patient Active Problem List   Diagnosis Date Noted  . Obesity (BMI 30-39.9) 04/10/2018  . 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 Acne 12/27/2011  . 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 WISDOM TOOTH EXTRACTION       OB History    Gravida  0   Para      Term      Preterm      AB      Living        SAB      TAB      Ectopic      Multiple      Live Births               Home Medications    Prior to Admission medications   Medication Sig Start Date End Date Taking? Authorizing Provider  adapalene (DIFFERIN) 0.1 % cream Apply topically at bedtime. 04/10/18   Leamon Arnt, MD  cetirizine (ZYRTEC) 10 MG tablet Take 1 tablet (10 mg total) by mouth daily. 07/29/18   Leamon Arnt, MD  famotidine (PEPCID) 20 MG tablet Take 1 tablet (20 mg total) by mouth 2 (two) times daily. 07/29/18   Leamon Arnt, MD  ferrous sulfate (FERROUSUL) 325 (65 FE) MG tablet Take 1 tablet (325 mg total) by mouth 2 (two) times  daily. 10/25/12   Leftwich-Kirby, Kathie Dike, CNM  hydrOXYzine (ATARAX/VISTARIL) 25 MG tablet Take 1 tablet (25 mg total) by mouth 3 (three) times daily as needed. 07/29/18   Leamon Arnt, MD    Family History No family history on file.  Social History Social History   Tobacco Use  . Smoking status: Never Smoker  . Smokeless tobacco: Never Used  Substance Use Topics  . Alcohol use: No  . Drug use: Yes    Types: Marijuana     Allergies   Codeine, Grassleaf sweetflag rhizome, and Nickel   Review of Systems Review of Systems  Skin: Positive for wound.  Neurological: Negative for weakness and numbness.  Hematological: Does not bruise/bleed easily.     Physical Exam Updated Vital Signs BP (!) 148/78 (BP Location: Right Arm)   Pulse 79   Temp 98.6 F (37 C) (Oral)   Resp 15   SpO2 100%   Physical Exam Vitals signs and nursing note reviewed.  Constitutional:      General: She is not in acute distress.    Appearance: She is well-developed.  HENT:  Head: Normocephalic and atraumatic.  Neck:     Musculoskeletal: Normal range of motion.  Pulmonary:     Effort: Pulmonary effort is normal.  Abdominal:     General: There is no distension.  Musculoskeletal: Normal range of motion.  Skin:    General: Skin is warm.     Capillary Refill: Capillary refill takes less than 2 seconds.     Findings: No rash.     Comments: 2 cm lack to the anterior right thigh without active bleeding.  Approximately 1 cm deep.  No fascial or muscular involvement.  Strength of right extremity 5+.  Ambulatory with out difficulty.  Pedal pulses intact.  No distal numbness.  Good distal cap refill.  Neurological:     Mental Status: She is alert and oriented to person, place, and time.      ED Treatments / Results  Labs (all labs ordered are listed, but only abnormal results are displayed) Labs Reviewed - No data to display  EKG None  Radiology No results found.  Procedures  .Marland KitchenLaceration Repair  Date/Time: 01/29/2019 9:11 AM Performed by: Alveria Apley, PA-C Authorized by: Alveria Apley, PA-C   Consent:    Consent obtained:  Verbal   Consent given by:  Patient   Risks discussed:  Infection, need for additional repair, nerve damage, poor wound healing, poor cosmetic result, pain, retained foreign body, tendon damage and vascular damage Anesthesia (see MAR for exact dosages):    Anesthesia method:  Local infiltration   Local anesthetic:  Lidocaine 2% WITH epi Laceration details:    Location:  Leg   Leg location:  R upper leg   Length (cm):  2   Depth (mm):  10 Pre-procedure details:    Preparation:  Patient was prepped and draped in usual sterile fashion Exploration:    Wound exploration: wound explored through full range of motion and entire depth of wound probed and visualized     Wound extent: no muscle damage noted, no nerve damage noted, no tendon damage noted and no vascular damage noted   Treatment:    Area cleansed with:  Saline   Amount of cleaning:  Standard   Irrigation solution:  Sterile water   Irrigation method:  Syringe Skin repair:    Repair method:  Sutures   Suture size:  4-0   Suture material:  Prolene   Suture technique:  Horizontal mattress   Number of sutures:  3 Approximation:    Approximation:  Close Post-procedure details:    Dressing:  Sterile dressing   Patient tolerance of procedure:  Tolerated well, no immediate complications Comments:     Subcu repair with 5-0 fast-absorbing gut.  2 sutures placed.   (including critical care time)  Medications Ordered in ED Medications  lidocaine-EPINEPHrine (XYLOCAINE W/EPI) 2 %-1:200000 (PF) injection 10 mL (10 mLs Infiltration Given by Other 01/29/19 0849)  Tdap (BOOSTRIX) injection 0.5 mL (0.5 mLs Intramuscular Given 01/29/19 0832)  hydrOXYzine (ATARAX/VISTARIL) tablet 25 mg (25 mg Oral Given 01/29/19 0849)     Initial Impression / Assessment and Plan / ED Course   I have reviewed the triage vital signs and the nursing notes.  Pertinent labs & imaging results that were available during my care of the patient were reviewed by me and considered in my medical decision making (see chart for details).        Patient presenting for evaluation of right eye laceration.  Physical exam reassuring she is neurovascularly intact.  Laceration is moderately deep,  approximately 1 cm depth, however no fascial or muscular involvement.  Laceration repaired as described above.  Due to gaping, 2 subcu stitches were placed as well as 3 sutures used for skin repair.  Discussed aftercare instructions.  Tetanus updated today.  At this time, patient appears safe for discharge.  Return precautions given.  Patient states she understands and agrees to plan.  Final Clinical Impressions(s) / ED Diagnoses   Final diagnoses:  Laceration of right lower extremity, initial encounter    ED Discharge Orders    None       Alveria ApleyCaccavale, Fredy Gladu, PA-C 01/29/19 0914    Virgina Norfolkuratolo, Adam, DO 01/29/19 1224

## 2019-03-03 ENCOUNTER — Ambulatory Visit: Payer: BC Managed Care – PPO | Attending: Internal Medicine

## 2019-03-03 DIAGNOSIS — Z20822 Contact with and (suspected) exposure to covid-19: Secondary | ICD-10-CM

## 2019-03-04 LAB — NOVEL CORONAVIRUS, NAA: SARS-CoV-2, NAA: NOT DETECTED

## 2019-04-13 ENCOUNTER — Encounter: Payer: BLUE CROSS/BLUE SHIELD | Admitting: Family Medicine

## 2019-04-13 DIAGNOSIS — Z0289 Encounter for other administrative examinations: Secondary | ICD-10-CM

## 2019-04-21 ENCOUNTER — Encounter: Payer: Self-pay | Admitting: Family Medicine

## 2019-06-03 ENCOUNTER — Other Ambulatory Visit: Payer: Self-pay | Admitting: Family Medicine

## 2019-06-06 ENCOUNTER — Other Ambulatory Visit: Payer: Self-pay

## 2019-06-06 ENCOUNTER — Emergency Department (HOSPITAL_COMMUNITY)
Admission: EM | Admit: 2019-06-06 | Discharge: 2019-06-06 | Disposition: A | Discharge status: Home / Self Care | Payer: BC Managed Care – PPO | Attending: Emergency Medicine | Admitting: Emergency Medicine

## 2019-06-06 ENCOUNTER — Encounter (HOSPITAL_COMMUNITY): Payer: Self-pay | Admitting: Emergency Medicine

## 2019-06-06 DIAGNOSIS — W268XXD Contact with other sharp object(s), not elsewhere classified, subsequent encounter: Secondary | ICD-10-CM | POA: Insufficient documentation

## 2019-06-06 DIAGNOSIS — S71111D Laceration without foreign body, right thigh, subsequent encounter: Secondary | ICD-10-CM | POA: Insufficient documentation

## 2019-06-06 DIAGNOSIS — R21 Rash and other nonspecific skin eruption: Secondary | ICD-10-CM | POA: Insufficient documentation

## 2019-06-06 DIAGNOSIS — Z4802 Encounter for removal of sutures: Secondary | ICD-10-CM

## 2019-06-06 NOTE — Discharge Instructions (Signed)
Avoid hot showers and apply thick lotion after bathing such as Aveeno, Eucerin, Aquaphor Please follow up with your doctor if you continue to have problems with the rash

## 2019-06-06 NOTE — ED Provider Notes (Signed)
[facility] DEPT Provider Note   CSN: [id number] Arrival date & time: 06/06/19  1613     History Chief Complaint  Patient presents with  . Suture / Staple Removal    Anna Potts is a 24 y.o. female who presents for suture removal and a rash.  Patient states that she was here in December after cutting herself with a box cutter on her right leg and had 3 stitches placed.  She remove the stitches herself because she works 2 jobs and did not have time to come back.  She was able to remove 2 of the stitches but the other when she cut too short and was not able to fully remove.  She states the area has become sore and that is bothering her in the throat she came back today to have it fully removed.  Additionally she has been having a rash that is on her legs but also all over her body.  She is seen her doctor for it and was given hydroxyzine she has not had much relief.  She denies fever or allergies.    HPI     History reviewed. No pertinent past medical history.  Patient Active Problem List   Diagnosis Date Noted  . Obesity (BMI 30-39.9) 04/10/2018  . 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 Acne 12/27/2011  . 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 WISDOM TOOTH EXTRACTION       OB History    Gravida  0   Para      Term      Preterm      AB      Living        SAB      TAB      Ectopic      Multiple      Live Births              No family history on file.  Social History   Tobacco Use  . Smoking status: Never Smoker  . Smokeless tobacco: Never Used  Substance Use Topics  . Alcohol use: No  . Drug use: Yes    Types: Marijuana    Home Medications Prior to Admission medications   Medication Sig Start Date End Date Taking? Authorizing Provider  adapalene (DIFFERIN) 0.1 % cream Apply topically at bedtime. 04/10/18   Leamon Arnt, MD  cetirizine (ZYRTEC) 10 MG  tablet Take 1 tablet (10 mg total) by mouth daily. 07/29/18   Leamon Arnt, MD  famotidine (PEPCID) 20 MG tablet Take 1 tablet (20 mg total) by mouth 2 (two) times daily. 07/29/18   Leamon Arnt, MD  ferrous sulfate (FERROUSUL) 325 (65 FE) MG tablet Take 1 tablet (325 mg total) by mouth 2 (two) times daily. 10/25/12   Leftwich-Kirby, Kathie Dike, CNM  hydrOXYzine (ATARAX/VISTARIL) 25 MG tablet Take 1 tablet (25 mg total) by mouth 3 (three) times daily as needed. 07/29/18   Leamon Arnt, MD    Allergies    Codeine, Grassleaf sweetflag rhizome, and Nickel  Review of Systems   Review of Systems  Constitutional: Negative for fever.  Skin: Positive for rash.       +suture implanted in the R thigh    Physical Exam Updated Vital Signs BP 131/80 (BP Location: Left Arm)   Pulse 77   Temp 98.3 F (36.8 C) (Oral)   Resp 16   SpO2 98%  Physical Exam Vitals and nursing note reviewed.  Constitutional:      General: She is not in acute distress.    Appearance: She is well-developed. She is obese. She is not ill-appearing.  HENT:     Head: Normocephalic and atraumatic.  Eyes:     General: No scleral icterus.       Right eye: No discharge.        Left eye: No discharge.     Conjunctiva/sclera: Conjunctivae normal.     Pupils: Pupils are equal, round, and reactive to light.  Cardiovascular:     Rate and Rhythm: Normal rate.  Pulmonary:     Effort: Pulmonary effort is normal. No respiratory distress.  Abdominal:     General: There is no distension.  Musculoskeletal:     Cervical back: Normal range of motion.  Skin:    General: Skin is warm and dry.     Findings: Rash (eczematous rash of the bilateral thighs) present.     Comments: Prolene suture embedded in the R thigh.   Neurological:     Mental Status: She is alert and oriented to person, place, and time.  Psychiatric:        Behavior: Behavior normal.     ED Results / Procedures / Treatments   Labs (all labs ordered are  listed, but only abnormal results are displayed) Labs Reviewed - No data to display  EKG None  Radiology No results found.  Procedures .Foreign Body Removal  Date/Time: 06/06/2019 5:34 PM Performed by: Bethel Born, PA-C Authorized by: Bethel Born, PA-C  Consent: Verbal consent obtained. Written consent obtained. Risks and benefits: risks, benefits and alternatives were discussed Consent given by: patient Patient understanding: patient does not state understanding of the procedure being performed Patient consent: the patient's understanding of the procedure does not match consent given Procedure consent: procedure consent does not match procedure scheduled Relevant documents: relevant documents not present or verified Test results: test results not available Site marked: the operative site was not marked Imaging studies: imaging studies not available Patient identity confirmed: verbally with patient Time out: Immediately prior to procedure a "time out" was called to verify the correct patient, procedure, equipment, support staff and site/side marked as required. Intake: R thigh. Anesthesia: local infiltration  Anesthesia: Local Anesthetic: lidocaine 1% without epinephrine Anesthetic total: 1 mL  Sedation: Patient sedated: no  Patient restrained: no Complexity: simple 1 objects recovered. Objects recovered: suture Post-procedure assessment: foreign body removed Patient tolerance: patient tolerated the procedure well with no immediate complications   (including critical care time)    Medications Ordered in ED Medications - No data to display  ED Course  I have reviewed the triage vital signs and the nursing notes.  Pertinent labs & imaging results that were available during my care of the patient were reviewed by me and considered in my medical decision making (see chart for details).  24 year old female with embedded suture that is been present for  approximately 4 months is requesting suture removal.  Area was anesthetized and skin was opened and suture was easily removed.  She is also requesting guidance regarding a chronic rash that she has.  It appears eczematous.  Advised to avoid hot showers and use hydrating lotions.  She was offered steroid cream but declined stating that she will follow-up with her doctor about this.  MDM Rules/Calculators/A&P  Final Clinical Impression(s) / ED Diagnoses Final diagnoses:  Visit for suture removal  Rash and nonspecific skin eruption    Rx / DC Orders ED Discharge Orders    None       Bethel Born, PA-C 06/06/19 1736    Gerhard Munch, MD 06/06/19 2208

## 2019-06-06 NOTE — ED Triage Notes (Signed)
Patient here from home reporting that she needs 1 suture removed and reports rash "all over body". Denies allergies.

## 2019-06-11 ENCOUNTER — Other Ambulatory Visit: Payer: Self-pay

## 2019-06-11 ENCOUNTER — Encounter: Payer: Self-pay | Admitting: Family Medicine

## 2019-06-11 ENCOUNTER — Ambulatory Visit (INDEPENDENT_AMBULATORY_CARE_PROVIDER_SITE_OTHER): Payer: Self-pay | Admitting: Family Medicine

## 2019-06-11 VITALS — BP 118/82 | HR 72 | Temp 98.0°F | Resp 14 | Ht 65.0 in | Wt 257.4 lb

## 2019-06-11 DIAGNOSIS — L503 Dermatographic urticaria: Secondary | ICD-10-CM

## 2019-06-11 MED ORDER — HYDROXYZINE HCL 25 MG PO TABS: 25.0000 mg | ORAL_TABLET | Freq: Three times a day (TID) | ORAL | 3 refills | Status: DC | PRN

## 2019-06-11 MED ORDER — FAMOTIDINE 20 MG PO TABS: 20.0000 mg | ORAL_TABLET | Freq: Every day | ORAL | 3 refills | Status: DC

## 2019-06-11 NOTE — Progress Notes (Signed)
   Subjective  CC:  Chief Complaint  Patient presents with  . Medication Management    states that Hydroxyzine works for several days but then hives return    HPI: Anna Potts is a 24 y.o. female who presents to the office today to address the problems listed above in the chief complaint.  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 Using hydroxyzine only prn. See last few notes. No new sxs.   overdue for cpe and first pap  Wants to lose weight. Eats poorly. No exercise. Works two jobs. Assessment  1. Dermatographism      Plan   dermatographism:  Further education given. Need to take the H1 and H2 blockers daily and hydroxyzine prn. Ordered.   Return for pap  Discussed better diet.   Follow up: cpe  Visit date not found  No orders of the defined types were placed in this encounter.  No orders of the defined types were placed in this encounter.     I reviewed the patients updated PMH, FH, and SocHx.    Patient Active Problem List   Diagnosis Date Noted  . Obesity (BMI 30-39.9) 04/10/2018  . 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 Acne 12/27/2011  . 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 adapalene (DIFFERIN) 0.1 % cream Apply topically at bedtime.  . hydrOXYzine (ATARAX/VISTARIL) 25 MG tablet Take 1 tablet (25 mg total) by mouth 3 (three) times daily as needed.    Allergies: Patient is allergic to codeine; grassleaf sweetflag rhizome; and nickel. Family History: Patient family history is not on file. Social History:  Patient  reports that she has never smoked. She has never used smokeless tobacco. She reports current drug use. Drug: Marijuana. She reports that she does not drink alcohol.  Review of Systems: Constitutional: Negative for fever malaise or anorexia Cardiovascular: negative for chest pain Respiratory: negative for SOB or persistent cough Gastrointestinal: negative for abdominal pain  Objective    Vitals: BP 118/82   Pulse 72   Temp 98 F (36.7 C) (Temporal)   Resp 14   Ht 5\' 5"  (1.651 m)   Wt 257 lb 6.4 oz (116.8 kg)   SpO2 98%   BMI 42.83 kg/m  General: no acute distress , A&Ox3 Skin:  Warm, red papules on neck where she is scratching.     Commons side effects, risks, benefits, and alternatives for medications and treatment plan prescribed today were discussed, and the patient expressed understanding of the given instructions. Patient is instructed to call or message via MyChart if he/she has any questions or concerns regarding our treatment plan. No barriers to understanding were identified. We discussed Red Flag symptoms and signs in detail. Patient expressed understanding regarding what to do in case of urgent or emergency type symptoms.   Medication list was reconciled, printed and provided to the patient in AVS. Patient instructions and summary information was reviewed with the patient as documented in the AVS. This note was prepared with assistance of Dragon voice recognition software. Occasional wrong-word or sound-a-like substitutions may have occurred due to the inherent limitations of voice recognition software  This visit occurred during the SARS-CoV-2 public health emergency.  Safety protocols were in place, including screening questions prior to the visit, additional usage of staff PPE, and extensive cleaning of exam room while observing appropriate contact time as indicated for disinfecting solutions.

## 2019-06-11 NOTE — Patient Instructions (Addendum)
Please return for your annual complete physical; please come fasting. And pap smear.   Take the famotidine and generic zyrtec nightly. Add hydroxyzine as needed for itching. You could use generic benadryl instead of the hydroxyzine if it is cheaper.   Take care and eat better! If you have any questions or concerns, please don't hesitate to send me a message via MyChart or call the office at [phone]. Thank you for visiting with [place] today! It's our pleasure caring for you.

## 2019-07-10 ENCOUNTER — Other Ambulatory Visit: Payer: Self-pay

## 2019-07-10 ENCOUNTER — Ambulatory Visit (HOSPITAL_COMMUNITY)
Admission: EM | Admit: 2019-07-10 | Discharge: 2019-07-10 | Disposition: A | Discharge status: Home / Self Care | Payer: Self-pay | Attending: Internal Medicine | Admitting: Internal Medicine

## 2019-07-10 ENCOUNTER — Encounter (HOSPITAL_COMMUNITY): Payer: Self-pay | Admitting: Internal Medicine

## 2019-07-10 DIAGNOSIS — N939 Abnormal uterine and vaginal bleeding, unspecified: Secondary | ICD-10-CM | POA: Insufficient documentation

## 2019-07-10 HISTORY — DX: Irregular menstruation, unspecified: N92.6

## 2019-07-10 HISTORY — DX: Iron deficiency anemia, unspecified: D50.9

## 2019-07-10 HISTORY — DX: Dermatographic urticaria: L50.3

## 2019-07-10 LAB — HIV ANTIBODY (ROUTINE TESTING W REFLEX): HIV Screen 4th Generation wRfx: NONREACTIVE

## 2019-07-10 LAB — TSH: TSH: 1.13 u[IU]/mL (ref 0.350–4.500)

## 2019-07-10 LAB — CBC
HCT: 38.6 % (ref 36.0–46.0)
Hemoglobin: 12 g/dL (ref 12.0–15.0)
MCH: 28.7 pg (ref 26.0–34.0)
MCHC: 31.1 g/dL (ref 30.0–36.0)
MCV: 92.3 fL (ref 80.0–100.0)
Platelets: 341 10*3/uL (ref 150–400)
RBC: 4.18 MIL/uL (ref 3.87–5.11)
RDW: 12.3 % (ref 11.5–15.5)
WBC: 5.6 10*3/uL (ref 4.0–10.5)
nRBC: 0 % (ref 0.0–0.2)

## 2019-07-10 LAB — IRON: Iron: 121 ug/dL (ref 28–170)

## 2019-07-10 LAB — POC URINE PREG, ED: Preg Test, Ur: NEGATIVE

## 2019-07-10 NOTE — ED Triage Notes (Signed)
Pt  Present vaginal bleeding, symptoms started on June 24, 2019. She has been soaking up 4 pads a day.

## 2019-07-10 NOTE — Discharge Instructions (Signed)
Get back on your iron and take it three times a day We will inform you via mychart when your results are back.

## 2019-07-10 NOTE — ED Provider Notes (Signed)
[facility]    CSN: [id number] Arrival date & time: 07/10/19  1546      History   Chief Complaint Chief Complaint  Patient presents with  . Vaginal Bleeding    HPI Zari Viles is a 24 y.o. female. who present with persistent bleeding since 4/26, the one prior to this was 01/2019. Last sexual encounter was 02/2019 and he wore condoms. She has not been on birth control x 4 y and not been taking Iron which she has at home. Last pap 2019 and was normal, she was told by GYN she would have a hard time getting pregnant. Denies abnormal vaginal discharge prior to onset of bleeding. She has history of irregular periods since Menarche and had heavy bleeding like this when she was 24 y/o.She was placed on birth control pills which regulated her periods but she d/c 4 years ago.     Patient Active Problem List   Diagnosis Date Noted  . Obesity (BMI 30-39.9) 04/10/2018  . 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 Acne 12/27/2011  . 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 WISDOM TOOTH EXTRACTION      OB History    Gravida  0   Para      Term      Preterm      AB      Living        SAB      TAB      Ectopic      Multiple      Live Births               Home Medications    Prior to Admission medications   Medication Sig Start Date End Date Taking? Authorizing Provider  famotidine (PEPCID) 20 MG tablet Take 1 tablet (20 mg total) by mouth daily. 06/11/19   Willow Ora, MD  ferrous sulfate (FERROUSUL) 325 (65 FE) MG tablet Take 1 tablet (325 mg total) by mouth 2 (two) times daily. Patient not taking: Reported on 06/11/2019 10/25/12   Sharen Counter A, CNM  hydrOXYzine (ATARAX/VISTARIL) 25 MG tablet Take 1 tablet (25 mg total) by mouth 3 (three) times daily as needed. 06/11/19   Willow Ora, MD  cetirizine (ZYRTEC) 10 MG tablet Take 1 tablet (10 mg total) by mouth daily. Patient not taking:  Reported on 06/11/2019 07/29/18 07/10/19  Willow Ora, MD    Family History No family history on file.  Social History Social History   Tobacco Use  . Smoking status: Never Smoker  . Smokeless tobacco: Never Used  Substance Use Topics  . Alcohol use: No  . Drug use: Yes    Types: Marijuana     Allergies   Codeine, Grassleaf sweetflag rhizome, and Nickel   Review of Systems Review of Systems  Constitutional: Positive for fatigue. Negative for activity change, appetite change, chills and fever.  Respiratory: Negative for shortness of breath.   Cardiovascular: Negative for chest pain.  Gastrointestinal: Positive for abdominal pain. Negative for nausea and vomiting.  Genitourinary: Positive for menstrual problem, pelvic pain and vaginal bleeding. Negative for dysuria, enuresis, frequency, hematuria, urgency, vaginal discharge and vaginal pain.  Musculoskeletal: Negative for arthralgias.  Skin: Negative for rash and wound.  Neurological: Negative for dizziness and light-headedness.  Hematological: Does not bruise/bleed easily.   Physical Exam Triage Vital Signs ED Triage Vitals  Enc Vitals Group     BP 07/10/19 1627 118/80  Pulse Rate 07/10/19 1627 78     Resp 07/10/19 1627 18     Temp 07/10/19 1627 98.3 F (36.8 C)     Temp Source 07/10/19 1627 Oral     SpO2 07/10/19 1627 97 %     Weight --      Height --      Head Circumference --      Peak Flow --      Pain Score 07/10/19 1625 0     Pain Loc --      Pain Edu? --      Excl. in GC? --    No data found.  Updated Vital Signs BP 118/80 (BP Location: Right Arm)   Pulse 78   Temp 98.3 F (36.8 C) (Oral)   Resp 18   SpO2 97%   Visual Acuity Right Eye Distance:   Left Eye Distance:   Bilateral Distance:    Right Eye Near:   Left Eye Near:    Bilateral Near:     Physical Exam Vitals reviewed.  Constitutional:      Appearance: She is obese.  HENT:     Right Ear: External ear normal.     Left Ear:  External ear normal.  Eyes:     General: No scleral icterus.    Conjunctiva/sclera: Conjunctivae normal.     Pupils: Pupils are equal, round, and reactive to light.  Cardiovascular:     Rate and Rhythm: Normal rate and regular rhythm.     Heart sounds: No murmur.  Pulmonary:     Effort: Pulmonary effort is normal.     Breath sounds: Normal breath sounds.  Abdominal:     General: There is no distension.     Palpations: Abdomen is soft. There is no mass.     Tenderness: There is abdominal tenderness. There is no guarding or rebound.     Hernia: No hernia is present.     Comments: Has mild tenderness over suprapubic region  Musculoskeletal:        General: Normal range of motion.     Cervical back: Neck supple.  Skin:    General: Skin is warm and dry.  Neurological:     Mental Status: She is alert and oriented to person, place, and time.     Gait: Gait normal.  Psychiatric:        Mood and Affect: Mood normal.        Behavior: Behavior normal.        Thought Content: Thought content normal.        Judgment: Judgment normal.      UC Treatments / Results  Labs (all labs ordered are listed, but only abnormal results are displayed) Labs Reviewed  TSH  CBC  IRON  HIV ANTIBODY (ROUTINE TESTING W REFLEX)  POC URINE PREG, ED  URINE CYTOLOGY ANCILLARY ONLY    EKG   Radiology No results found.  Procedures Procedures (including critical care time)  Medications Ordered in UC Medications - No data to display  Initial Impression / Assessment and Plan / UC Course  I have reviewed the triage vital signs and the nursing notes. Pertinent labs  results that were available during my care of the patient were reviewed by me and considered in my medical decision making (see chart for details). I ordered CBC, Fe, and TSH.  She was advised to get back on her iron and needs to FU with GYN.    Final Clinical Impressions(s) /  UC Diagnoses   Final diagnoses:  Abnormal vaginal  bleeding   Discharge Instructions   None    ED Prescriptions    None     PDMP not reviewed this encounter.   Shelby Mattocks, PA-C 07/10/19 1736

## 2019-07-13 LAB — CERVICOVAGINAL ANCILLARY ONLY
Chlamydia: NEGATIVE
Comment: NEGATIVE
Comment: NORMAL
Neisseria Gonorrhea: NEGATIVE

## 2019-08-26 ENCOUNTER — Telehealth (INDEPENDENT_AMBULATORY_CARE_PROVIDER_SITE_OTHER): Payer: Self-pay | Admitting: Family Medicine

## 2019-08-26 ENCOUNTER — Encounter: Payer: Self-pay | Admitting: Family Medicine

## 2019-08-26 VITALS — Wt 243.0 lb

## 2019-08-26 DIAGNOSIS — R059 Cough, unspecified: Secondary | ICD-10-CM

## 2019-08-26 DIAGNOSIS — R05 Cough: Secondary | ICD-10-CM

## 2019-08-26 DIAGNOSIS — J040 Acute laryngitis: Secondary | ICD-10-CM

## 2019-08-26 DIAGNOSIS — R0981 Nasal congestion: Secondary | ICD-10-CM

## 2019-08-26 NOTE — Progress Notes (Signed)
Virtual Visit via Video Note  I connected with Anna Potts  on 08/26/19 at  3:20 PM EDT by a video enabled telemedicine application and verified that I am speaking with the correct person using two identifiers.  Location patient: home, [place] Location provider:work or home office Persons participating in the virtual visit: patient, provider  I discussed the limitations of evaluation and management by telemedicine and the availability of in person appointments. The patient expressed understanding and agreed to proceed.   HPI:  Acute visit for Cough and congestion: -started yesterday -symptoms include nasal congestion, hoarseness, cough -denies fevers, body aches, SOB, diarrhea, vomiting, loss of taste -denies any known sick contacts -denies any exposures to groups of people, but does go to the grocery stores where most people are no longer using masks -reports at work masks and COVID precautions are required -not vaccinated for COVID19  ROS: See pertinent positives and negatives per HPI.  Past Medical History:  Diagnosis Date   Dermatographia    Iron deficiency anemia    Menstrual periods irregular     Past Surgical History:  Procedure Laterality Date   WISDOM TOOTH EXTRACTION      No family history on file.  SOCIAL HX: see hpi   Current Outpatient Medications:    ferrous sulfate (FERROUSUL) 325 (65 FE) MG tablet, Take 1 tablet (325 mg total) by mouth 2 (two) times daily., Disp: 60 tablet, Rfl: 1   hydrOXYzine (ATARAX/VISTARIL) 25 MG tablet, Take 1 tablet (25 mg total) by mouth 3 (three) times daily as needed., Disp: 90 tablet, Rfl: 3  EXAM:  VITALS per patient if applicable:  GENERAL: alert, oriented, appears well and in no acute distress  HEENT: atraumatic, conjunttiva clear, no obvious abnormalities on inspection of external nose and ears  NECK: normal movements of the head and neck  LUNGS: on inspection no signs of respiratory distress, breathing rate appears  normal, no obvious gross SOB, gasping or wheezing  CV: no obvious cyanosis  MS: moves all visible extremities without noticeable abnormality  PSYCH/NEURO: pleasant and cooperative, no obvious depression or anxiety, speech and thought processing grossly intact  ASSESSMENT AND PLAN:  Discussed the following assessment and plan:  Sinus congestion  Cough  Laryngitis  -we discussed possible serious and likely etiologies, options for evaluation and workup, limitations of telemedicine visit vs in person visit, treatment, treatment risks and precautions. Pt prefers to treat via telemedicine empirically rather then risking or undertaking an in person visit at this moment. Suspect VURI. COVID19 also possible given not vaccinated. Discussed symptomatic care with OTC afrin, nasal saline, OTC cough medications. Discussed covid testing, limitations, home isolation and provided work note in patient instructions. Advised vaccination when able.  Patient agrees to seek prompt in person care if worsening, new symptoms arise, or if is not improving with treatment. About 22 minutes spent on this visit.    I discussed the assessment and treatment plan with the patient. The patient was provided an opportunity to ask questions and all were answered. The patient agreed with the plan and demonstrated an understanding of the instructions.   The patient was advised to call back or seek an in-person evaluation if the symptoms worsen or if the condition fails to improve as anticipated.   Terressa Koyanagi, DO

## 2019-08-26 NOTE — Patient Instructions (Addendum)
INSTRUCTIONS FOR UPPER RESPIRATORY  SYMPTOMS:  -please get a COVID19 test and stay home until you are feeling better if a negative test or a minimum of 10 days plus 1 day of no symptoms and feeling better.  -plenty of rest and fluids  -nasal saline wash 2-3 times daily (use prepackaged nasal saline or bottled/distilled water if making your own)   -can use AFRIN nasal spray for drainage and nasal congestion - but do NOT use longer then 3-4 days -can use tylenol (in no history of liver disease) or ibuprofen (if no history of kidney disease, bowel bleeding or significant heart disease) as directed for aches and sorethroat  -in the winter time, using a humidifier at night is helpful (please follow cleaning instructions)  -if you are taking a cough medication - use only as directed, may also try a teaspoon of honey to coat the throat and throat lozenges.  -for sore throat, salt water gargles can help  -follow up if you have fevers, facial pain, tooth pain, difficulty breathing or are worsening or symptoms persist longer then expected  Upper Respiratory Infection, Adult An upper respiratory infection (URI) is also known as the common cold. It is often caused by a type of germ (virus). Colds are easily spread (contagious). You can pass it to others by kissing, coughing, sneezing, or drinking out of the same glass. Usually, you get better in 1 to 3  weeks.  However, the cough can last for even longer. HOME CARE   Only take medicine as told by your doctor. Follow instructions provided above.  Drink enough water and fluids to keep your pee (urine) clear or pale yellow.  Get plenty of rest.  Return to work when your temperature is < 100 for 24 hours or as told by your doctor. You may use a face mask and wash your hands to stop your cold from spreading. GET HELP RIGHT AWAY IF:   After the first few days, you feel you are getting worse.  You have questions about your medicine.  You have chills,  shortness of breath, or red spit (mucus).  You have pain in the face for more then 1-2 days, especially when you bend forward.  You have a fever, puffy (swollen) neck, pain when you swallow, or white spots in the back of your throat.  You have a bad headache, ear pain, sinus pain, or chest pain.  You have a high-pitched whistling sound when you breathe in and out (wheezing).  You cough up blood.  You have sore muscles or a stiff neck. MAKE SURE YOU:   Understand these instructions.  Will watch your condition.  Will get help right away if you are not doing well or get worse. Document Released: 07/31/2007 Document Revised: 05/06/2011 Document Reviewed: 05/19/2013 [facility] Patient Information 2015 [place], [place]. This information is not intended to replace advice given to you by your health care provider. Make sure you discuss any questions you have with your health care provider.      WORK SLIP:   Patient Anna Potts,  1995-05-17, was seen for a medical visit today, 08/26/2019. Please excuse from work according to the [facility] guidelines for a COVID like illness. We advise 10 days minimum from the onset of symptoms (08/25/19) PLUS 1 day of no fever and improved symptoms. Will defer to employer for a sooner return to work if COVID19 testing is negative and the symptoms have resolved. Advise following CDC guidelines.   Sincerely: E-signature: Dr. Dahlia Client  [facility] [place] Ph: [phone]

## 2019-09-27 ENCOUNTER — Encounter: Payer: Self-pay | Admitting: Family Medicine

## 2019-11-26 ENCOUNTER — Encounter: Payer: Self-pay | Admitting: Family Medicine

## 2019-11-30 ENCOUNTER — Encounter (HOSPITAL_COMMUNITY): Payer: Self-pay

## 2019-11-30 ENCOUNTER — Other Ambulatory Visit: Payer: Self-pay

## 2019-11-30 ENCOUNTER — Ambulatory Visit (HOSPITAL_COMMUNITY)
Admission: EM | Admit: 2019-11-30 | Discharge: 2019-11-30 | Disposition: A | Discharge status: Home / Self Care | Payer: Self-pay | Attending: Family Medicine | Admitting: Family Medicine

## 2019-11-30 ENCOUNTER — Telehealth: Payer: Self-pay

## 2019-11-30 DIAGNOSIS — N939 Abnormal uterine and vaginal bleeding, unspecified: Secondary | ICD-10-CM

## 2019-11-30 DIAGNOSIS — N938 Other specified abnormal uterine and vaginal bleeding: Secondary | ICD-10-CM

## 2019-11-30 LAB — CBC WITH DIFFERENTIAL/PLATELET
Abs Immature Granulocytes: 0.01 10*3/uL (ref 0.00–0.07)
Basophils Absolute: 0 10*3/uL (ref 0.0–0.1)
Basophils Relative: 1 %
Eosinophils Absolute: 0 10*3/uL (ref 0.0–0.5)
Eosinophils Relative: 1 %
HCT: 37.2 % (ref 36.0–46.0)
Hemoglobin: 11.7 g/dL — ABNORMAL LOW (ref 12.0–15.0)
Immature Granulocytes: 0 %
Lymphocytes Relative: 39 %
Lymphs Abs: 2.3 10*3/uL (ref 0.7–4.0)
MCH: 29 pg (ref 26.0–34.0)
MCHC: 31.5 g/dL (ref 30.0–36.0)
MCV: 92.1 fL (ref 80.0–100.0)
Monocytes Absolute: 0.4 10*3/uL (ref 0.1–1.0)
Monocytes Relative: 7 %
Neutro Abs: 3.2 10*3/uL (ref 1.7–7.7)
Neutrophils Relative %: 52 %
Platelets: 323 10*3/uL (ref 150–400)
RBC: 4.04 MIL/uL (ref 3.87–5.11)
RDW: 12 % (ref 11.5–15.5)
WBC: 6 10*3/uL (ref 4.0–10.5)
nRBC: 0 % (ref 0.0–0.2)

## 2019-11-30 LAB — POCT URINALYSIS DIPSTICK, ED / UC
Bilirubin Urine: NEGATIVE
Glucose, UA: NEGATIVE mg/dL
Ketones, ur: NEGATIVE mg/dL
Leukocytes,Ua: NEGATIVE
Nitrite: NEGATIVE
Protein, ur: NEGATIVE mg/dL
Specific Gravity, Urine: 1.025 (ref 1.005–1.030)
Urobilinogen, UA: 1 mg/dL (ref 0.0–1.0)
pH: 7 (ref 5.0–8.0)

## 2019-11-30 LAB — HIV ANTIBODY (ROUTINE TESTING W REFLEX): HIV Screen 4th Generation wRfx: NONREACTIVE

## 2019-11-30 MED ORDER — NORGESTIM-ETH ESTRAD TRIPHASIC 0.18/0.215/0.25 MG-25 MCG PO TABS: 1.0000 | ORAL_TABLET | Freq: Every day | ORAL | 0 refills | Status: DC

## 2019-11-30 NOTE — Discharge Instructions (Addendum)
Address: [address], [place], [place] [address] Closed ? Opens 7AM Wed Phone: [phone]

## 2019-11-30 NOTE — Telephone Encounter (Signed)
Attempted to contact patient to offer appt, mailbox full - unable to leave message

## 2019-11-30 NOTE — ED Provider Notes (Signed)
[facility]    CSN: [id number] Arrival date & time: 11/30/19  1929      History   Chief Complaint Chief Complaint  Patient presents with  . Abnormal Vaginal Bleeding    HPI Anna Potts is a 24 y.o. female.   Here today complaining of months of heavy irregular uterine bleeding. States this has been an ongoing issue for almost a decade at this point, was previously on oral contraceptives which did help but d/c'd them 4 years ago. The past few days the bleeding has been so heavy that she is filling up pads several times each day and feeling very fatigued. Denies pelvic pain, N/V, new supplements or medications, known exposures to STIs. Declines chance of pregnancy. Did restart her iron supplement recently but otherwise not trying anything OTC for this.      Past Medical History:  Diagnosis Date  . Dermatographia   . Iron deficiency anemia   . Menstrual periods irregular     Patient Active Problem List   Diagnosis Date Noted  . Obesity (BMI 30-39.9) 04/10/2018  . 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 Acne 12/27/2011  . 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 WISDOM TOOTH EXTRACTION      OB History    Gravida  0   Para      Term      Preterm      AB      Living        SAB      TAB      Ectopic      Multiple      Live Births               Home Medications    Prior to Admission medications   Medication Sig Start Date End Date Taking? Authorizing Provider  ferrous sulfate (FERROUSUL) 325 (65 FE) MG tablet Take 1 tablet (325 mg total) by mouth 2 (two) times daily. 10/25/12   Leftwich-Kirby, Wilmer Floor, CNM  hydrOXYzine (ATARAX/VISTARIL) 25 MG tablet Take 1 tablet (25 mg total) by mouth 3 (three) times daily as needed. 06/11/19   Willow Ora, MD  Norgestimate-Ethinyl Estradiol Triphasic (ORTHO TRI-CYCLEN LO) 0.18/0.215/0.25 MG-25 MCG tab Take 1 tablet by mouth daily. 11/30/19   Particia Nearing, PA-C  cetirizine (ZYRTEC) 10 MG tablet Take 1 tablet (10 mg total) by mouth daily. Patient not taking: Reported on 06/11/2019 07/29/18 07/10/19  Willow Ora, MD    Family History Family History  Family history unknown: Yes    Social History Social History   Tobacco Use  . Smoking status: Never Smoker  . Smokeless tobacco: Never Used  Substance Use Topics  . Alcohol use: No  . Drug use: Yes    Types: Marijuana     Allergies   Codeine, Grassleaf sweetflag rhizome, and Nickel   Review of Systems Review of Systems PER HPI    Physical Exam Triage Vital Signs ED Triage Vitals  Enc Vitals Group     BP 11/30/19 2057 (!) 119/58     Pulse Rate 11/30/19 2057 (!) 57     Resp 11/30/19 2057 17     Temp 11/30/19 2057 98.1 F (36.7 C)     Temp Source 11/30/19 2057 Oral     SpO2 11/30/19 2057 100 %     Weight --      Height --      Head Circumference --  Peak Flow --      Pain Score 11/30/19 2058 0     Pain Loc --      Pain Edu? --      Excl. in GC? --    No data found.  Updated Vital Signs BP (!) 119/58 (BP Location: Right Arm)   Pulse (!) 57   Temp 98.1 F (36.7 C) (Oral)   Resp 17   SpO2 100%   Visual Acuity Right Eye Distance:   Left Eye Distance:   Bilateral Distance:    Right Eye Near:   Left Eye Near:    Bilateral Near:     Physical Exam Vitals and nursing note reviewed.  Constitutional:      Appearance: Normal appearance. She is not ill-appearing.  HENT:     Head: Atraumatic.     Nose: Nose normal.     Mouth/Throat:     Mouth: Mucous membranes are moist.     Pharynx: Oropharynx is clear.  Eyes:     Extraocular Movements: Extraocular movements intact.     Conjunctiva/sclera: Conjunctivae normal.  Cardiovascular:     Rate and Rhythm: Normal rate and regular rhythm.     Heart sounds: Normal heart sounds.  Pulmonary:     Effort: Pulmonary effort is normal.     Breath sounds: Normal breath sounds.  Abdominal:      General: Bowel sounds are normal. There is no distension.     Palpations: Abdomen is soft.     Tenderness: There is no abdominal tenderness. There is no right CVA tenderness, left CVA tenderness or guarding.  Genitourinary:    Comments: GU exam deferred Musculoskeletal:        General: Normal range of motion.     Cervical back: Normal range of motion and neck supple.  Skin:    General: Skin is warm and dry.  Neurological:     Mental Status: She is alert and oriented to person, place, and time.  Psychiatric:        Mood and Affect: Mood normal.        Thought Content: Thought content normal.        Judgment: Judgment normal.      UC Treatments / Results  Labs (all labs ordered are listed, but only abnormal results are displayed) Labs Reviewed  POCT URINALYSIS DIPSTICK, ED / UC - Abnormal; Notable for the following components:      Result Value   Hgb urine dipstick LARGE (*)    All other components within normal limits  RPR  HIV ANTIBODY (ROUTINE TESTING W REFLEX)  CBC WITH DIFFERENTIAL/PLATELET  CERVICOVAGINAL ANCILLARY ONLY    EKG   Radiology No results found.  Procedures Procedures (including critical care time)  Medications Ordered in UC Medications - No data to display  Initial Impression / Assessment and Plan / UC Course  I have reviewed the triage vital signs and the nursing notes.  Pertinent labs & imaging results that were available during my care of the patient were reviewed by me and considered in my medical decision making (see chart for details).     Discussed different options, including hormonal contraceptives. Patient agreeable to an oral contraceptive and close PCP and GYN f/u. Information given for [facility] and she will call to schedule f/u. Continue iron supplement. Labs and aptima swab pending, U/A today benign.   Final Clinical Impressions(s) / UC Diagnoses   Final diagnoses:  Dysfunctional uterine bleeding     Discharge  Instructions     Address: [address], [place], [place] [address] Closed ? Opens 7AM Wed Phone: [phone]     ED Prescriptions    Medication Sig Dispense Auth. Provider   Norgestimate-Ethinyl Estradiol Triphasic (ORTHO TRI-CYCLEN LO) 0.18/0.215/0.25 MG-25 MCG tab Take 1 tablet by mouth daily. 28 tablet Particia Nearing, [place]     PDMP not reviewed this encounter.   Particia Nearing, [place] 11/30/19 2133

## 2019-11-30 NOTE — ED Triage Notes (Signed)
Pt presents with abnormal vaginal bleeding X 4 months with its worse being today.

## 2019-11-30 NOTE — Telephone Encounter (Signed)
Nurse Assessment Nurse: Mayford Knife, RN, Lupe Carney Date/Time Lamount Cohen Time): 11/29/2019 6:32:28 PM Confirm and document reason for call. If symptomatic, describe symptoms. ---Caller stats she needs to make an appt. She has been having excessive vaginal bleeding with abdominal pain. Has been having bleeding 4-5 months. Off and on for the past 2 weeks, saturating pads. passing clots. Does the patient have any new or worsening symptoms? ---Yes Will a triage be completed? ---Yes Related visit to physician within the last 2 weeks? ---No Does the PT have any chronic conditions? (i.e. diabetes, asthma, this includes High risk factors for pregnancy, etc.) ---No Is the patient pregnant or possibly pregnant? (Ask all females between the ages of 57-55) ---No Is this a behavioral health or substance abuse call? ---No Guidelines Guideline Title Affirmed Question Affirmed Notes Nurse Date/Time (Eastern Time) Vaginal Bleeding - Abnormal [1] Periods with > 6 soaked pads or tampons per day AND [2] last > 7 days Mayford Knife, RN, Rebekah 11/29/2019 6:35:07 PM Disp. Time Lamount Cohen Time) Disposition Final User 11/29/2019 6:39:51 PM SEE PCP WITHIN 3 DAYS Yes Turner, RN, Rebekah PLEASE NOTE: All timestamps contained within this report are represented as [place] Standard Time. CONFIDENTIALTY NOTICE: This fax transmission is intended only for the addressee. It contains information that is legally privileged, confidential or otherwise protected from use or disclosure. If you are not the intended recipient, you are strictly prohibited from reviewing, disclosing, copying using or disseminating any of this information or taking any action in reliance on or regarding this information. If you have received this fax in error, please notify us immediately by telephone so that we can arrange for its return to [place]. Phone: [phone], Toll-Free: [phone], Fax: [phone] Page: 2 of 2 Call Id: [id number] Caller Disagree/Comply  Comply Caller Understands Yes PreDisposition Call Doctor Care Advice Given Per Guideline SEE PCP WITHIN 3 DAYS: * You need to be seen within 2 or 3 days. * PCP VISIT: Call your doctor (or NP/PA) during regular office hours and make an appointment. A clinic or urgent care center are good places to go for care if your doctor's office is closed or you can't get an appointment. NOTE: If office will be open tomorrow, tell caller to call then, not in 3 days. CALL BACK IF: * Severe abdominal pain or dizziness occurs * You become worse CARE ADVICE given per Vaginal Bleeding, Abnormal (Adult

## 2019-12-01 LAB — RPR: RPR Ser Ql: NONREACTIVE

## 2019-12-02 LAB — CERVICOVAGINAL ANCILLARY ONLY
Bacterial Vaginitis (gardnerella): POSITIVE — AB
Candida Glabrata: NEGATIVE
Candida Vaginitis: POSITIVE — AB
Chlamydia: NEGATIVE
Comment: NEGATIVE
Comment: NEGATIVE
Comment: NEGATIVE
Comment: NEGATIVE
Comment: NEGATIVE
Comment: NORMAL
Neisseria Gonorrhea: NEGATIVE
Trichomonas: NEGATIVE

## 2019-12-03 ENCOUNTER — Telehealth (HOSPITAL_COMMUNITY): Payer: Self-pay | Admitting: Emergency Medicine

## 2019-12-03 MED ORDER — FLUCONAZOLE 150 MG PO TABS: 150.0000 mg | ORAL_TABLET | Freq: Once | ORAL | 0 refills | Status: AC

## 2019-12-03 MED ORDER — METRONIDAZOLE 500 MG PO TABS: 500.0000 mg | ORAL_TABLET | Freq: Two times a day (BID) | ORAL | 0 refills | Status: DC

## 2019-12-26 ENCOUNTER — Ambulatory Visit (HOSPITAL_COMMUNITY)
Admission: EM | Admit: 2019-12-26 | Discharge: 2019-12-26 | Disposition: A | Discharge status: Home / Self Care | Payer: Self-pay | Attending: Emergency Medicine | Admitting: Emergency Medicine

## 2019-12-26 ENCOUNTER — Encounter (HOSPITAL_COMMUNITY): Payer: Self-pay

## 2019-12-26 ENCOUNTER — Other Ambulatory Visit: Payer: Self-pay

## 2019-12-26 DIAGNOSIS — R109 Unspecified abdominal pain: Secondary | ICD-10-CM | POA: Insufficient documentation

## 2019-12-26 DIAGNOSIS — N920 Excessive and frequent menstruation with regular cycle: Secondary | ICD-10-CM | POA: Insufficient documentation

## 2019-12-26 DIAGNOSIS — Z3202 Encounter for pregnancy test, result negative: Secondary | ICD-10-CM

## 2019-12-26 LAB — POCT URINALYSIS DIPSTICK, ED / UC
Bilirubin Urine: NEGATIVE
Glucose, UA: NEGATIVE mg/dL
Hgb urine dipstick: NEGATIVE
Ketones, ur: NEGATIVE mg/dL
Nitrite: NEGATIVE
Protein, ur: NEGATIVE mg/dL
Specific Gravity, Urine: 1.025 (ref 1.005–1.030)
Urobilinogen, UA: 0.2 mg/dL (ref 0.0–1.0)
pH: 6 (ref 5.0–8.0)

## 2019-12-26 LAB — POC URINE PREG, ED: Preg Test, Ur: NEGATIVE

## 2019-12-26 MED ORDER — NAPROXEN 500 MG PO TABS: 500.0000 mg | ORAL_TABLET | Freq: Two times a day (BID) | ORAL | 0 refills | Status: DC

## 2019-12-26 NOTE — Discharge Instructions (Signed)
Heat application to abdomen.  Naproxen twice a day, take with food. Don't take additional ibuprofen.  We will notify of you any positive findings or if any changes to treatment are needed. If normal or otherwise without concern to your results, we will not call you. Please log on to your MyChart to review your results if interested in so.   Please follow up with gynecology for recheck and long term management.

## 2019-12-26 NOTE — ED Triage Notes (Signed)
Pt present abdominal pain with some spotting and cramping. Symptom stop a few weeks ago.

## 2019-12-27 LAB — CERVICOVAGINAL ANCILLARY ONLY
Bacterial Vaginitis (gardnerella): POSITIVE — AB
Candida Glabrata: NEGATIVE
Candida Vaginitis: NEGATIVE
Chlamydia: NEGATIVE
Comment: NEGATIVE
Comment: NEGATIVE
Comment: NEGATIVE
Comment: NEGATIVE
Comment: NEGATIVE
Comment: NORMAL
Neisseria Gonorrhea: NEGATIVE
Trichomonas: NEGATIVE

## 2019-12-28 ENCOUNTER — Telehealth (HOSPITAL_COMMUNITY): Payer: Self-pay | Admitting: Emergency Medicine

## 2019-12-28 MED ORDER — METRONIDAZOLE 500 MG PO TABS: 500.0000 mg | ORAL_TABLET | Freq: Two times a day (BID) | ORAL | 0 refills | Status: DC

## 2019-12-29 NOTE — ED Provider Notes (Signed)
[facility]    CSN: [id number] Arrival date & time: 12/26/19  1732      History   Chief Complaint Chief Complaint  Patient presents with  . Abdominal Pain    HPI Anna Potts is a 24 y.o. female.   Anna Potts presents with complaints of intermittent pelvic cramping. No current pain. Occasional spotting. Was seen her 10/5 and was treated for yeast and BV. She has completed treatment. She is inquiring if this has completely resolved. She was started on birth control on 10/5. No fevers. No urinary symptoms.     ROS per HPI, negative if not otherwise mentioned.      Past Medical History:  Diagnosis Date  . Dermatographia   . Iron deficiency anemia   . Menstrual periods irregular     Patient Active Problem List   Diagnosis Date Noted  . Obesity (BMI 30-39.9) 04/10/2018  . 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 Acne 12/27/2011  . 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 WISDOM TOOTH EXTRACTION      OB History    Gravida  0   Para      Term      Preterm      AB      Living        SAB      TAB      Ectopic      Multiple      Live Births               Home Medications    Prior to Admission medications   Medication Sig Start Date End Date Taking? Authorizing Provider  ferrous sulfate (FERROUSUL) 325 (65 FE) MG tablet Take 1 tablet (325 mg total) by mouth 2 (two) times daily. 10/25/12   Leftwich-Kirby, Wilmer Floor, CNM  hydrOXYzine (ATARAX/VISTARIL) 25 MG tablet Take 1 tablet (25 mg total) by mouth 3 (three) times daily as needed. 06/11/19   Willow Ora, MD  metroNIDAZOLE (FLAGYL) 500 MG tablet Take 1 tablet (500 mg total) by mouth 2 (two) times daily. 12/28/19   Lamptey, Britta Mccreedy, MD  naproxen (NAPROSYN) 500 MG tablet Take 1 tablet (500 mg total) by mouth 2 (two) times daily. 12/26/19   Georgetta Haber, NP  Norgestimate-Ethinyl Estradiol Triphasic (ORTHO TRI-CYCLEN LO)  0.18/0.215/0.25 MG-25 MCG tab Take 1 tablet by mouth daily. 11/30/19   Particia Nearing, PA-C  cetirizine (ZYRTEC) 10 MG tablet Take 1 tablet (10 mg total) by mouth daily. Patient not taking: Reported on 06/11/2019 07/29/18 07/10/19  Willow Ora, MD    Family History Family History  Family history unknown: Yes    Social History Social History   Tobacco Use  . Smoking status: Never Smoker  . Smokeless tobacco: Never Used  Substance Use Topics  . Alcohol use: No  . Drug use: Yes    Types: Marijuana     Allergies   Codeine, Grassleaf sweetflag rhizome, and Nickel   Review of Systems Review of Systems   Physical Exam Triage Vital Signs ED Triage Vitals [12/26/19 1803]  Enc Vitals Group     BP 124/61     Pulse Rate 81     Resp 16     Temp 98.9 F (37.2 C)     Temp Source Oral     SpO2 100 %     Weight      Height      Head  Circumference      Peak Flow      Pain Score 7     Pain Loc      Pain Edu?      Excl. in GC?    No data found.  Updated Vital Signs BP 124/61 (BP Location: Right Arm)   Pulse 81   Temp 98.9 F (37.2 C) (Oral)   Resp 16   SpO2 100%   Visual Acuity Right Eye Distance:   Left Eye Distance:   Bilateral Distance:    Right Eye Near:   Left Eye Near:    Bilateral Near:     Physical Exam Constitutional:      General: She is not in acute distress.    Appearance: She is well-developed.  Cardiovascular:     Rate and Rhythm: Normal rate.  Pulmonary:     Effort: Pulmonary effort is normal.  Abdominal:     Palpations: Abdomen is not rigid.     Tenderness: There is no abdominal tenderness. There is no guarding or rebound.  Genitourinary:    Comments: Denies sores, lesions, vaginal bleeding; no pelvic pain; gu exam deferred at this time, vaginal self swab collected.   Skin:    General: Skin is warm and dry.  Neurological:     Mental Status: She is alert and oriented to person, place, and time.      UC Treatments / Results    Labs (all labs ordered are listed, but only abnormal results are displayed) Labs Reviewed  POCT URINALYSIS DIPSTICK, ED / UC - Abnormal; Notable for the following components:      Result Value   Leukocytes,Ua TRACE (*)    All other components within normal limits  CERVICOVAGINAL ANCILLARY ONLY - Abnormal; Notable for the following components:   Bacterial Vaginitis (gardnerella) Positive (*)    All other components within normal limits  POC URINE PREG, ED    EKG   Radiology No results found.  Procedures Procedures (including critical care time)  Medications Ordered in UC Medications - No data to display  Initial Impression / Assessment and Plan / UC Course  I have reviewed the triage vital signs and the nursing notes.  Pertinent labs & imaging results that were available during my care of the patient were reviewed by me and considered in my medical decision making (see chart for details).     Non toxic. Benign physical exam. No abdominal findings on exam. Repeat vaginal cytology. Naproxen prn. Continue with birth control as prescribed for irregular bleeding. Follow up with gyn. Return precautions provided. Patient verbalized understanding and agreeable to plan.   Final Clinical Impressions(s) / UC Diagnoses   Final diagnoses:  Abdominal cramping  Spotting     Discharge Instructions     Heat application to abdomen.  Naproxen twice a day, take with food. Don't take additional ibuprofen.  We will notify of you any positive findings or if any changes to treatment are needed. If normal or otherwise without concern to your results, we will not call you. Please log on to your MyChart to review your results if interested in so.   Please follow up with gynecology for recheck and long term management.     ED Prescriptions    Medication Sig Dispense Auth. Provider   naproxen (NAPROSYN) 500 MG tablet Take 1 tablet (500 mg total) by mouth 2 (two) times daily. 30 tablet Georgetta Haber, NP     PDMP not reviewed this encounter.  Georgetta Haber, NP 12/29/19 0800

## 2020-01-05 ENCOUNTER — Encounter: Payer: Self-pay | Admitting: Family Medicine

## 2020-01-07 ENCOUNTER — Other Ambulatory Visit: Payer: Self-pay

## 2020-01-07 ENCOUNTER — Encounter (HOSPITAL_COMMUNITY): Payer: Self-pay | Admitting: Emergency Medicine

## 2020-01-07 ENCOUNTER — Emergency Department (HOSPITAL_COMMUNITY): Payer: Self-pay

## 2020-01-07 ENCOUNTER — Emergency Department (HOSPITAL_COMMUNITY)
Admission: EM | Admit: 2020-01-07 | Discharge: 2020-01-07 | Disposition: A | Discharge status: Home / Self Care | Payer: Self-pay | Attending: Emergency Medicine | Admitting: Emergency Medicine

## 2020-01-07 DIAGNOSIS — S90212A Contusion of left great toe with damage to nail, initial encounter: Secondary | ICD-10-CM | POA: Insufficient documentation

## 2020-01-07 DIAGNOSIS — T1490XA Injury, unspecified, initial encounter: Secondary | ICD-10-CM

## 2020-01-07 DIAGNOSIS — W208XXA Other cause of strike by thrown, projected or falling object, initial encounter: Secondary | ICD-10-CM | POA: Insufficient documentation

## 2020-01-07 DIAGNOSIS — M79672 Pain in left foot: Secondary | ICD-10-CM | POA: Insufficient documentation

## 2020-01-07 MED ORDER — IBUPROFEN 400 MG PO TABS
600.0000 mg | ORAL_TABLET | Freq: Once | ORAL | Status: AC
  Administered 2020-01-07: 600 mg via ORAL
  Filled 2020-01-07: qty 1

## 2020-01-07 NOTE — Discharge Instructions (Addendum)
Please take Ibuprofen (Advil, motrin) and Tylenol (acetaminophen) to relieve your pain.  You may take up to 600 MG (3 pills) of normal strength ibuprofen every 8 hours as needed.  In between doses of ibuprofen you make take tylenol, up to 1,000 mg (two extra strength pills).  Do not take more than 3,000 mg tylenol in a 24 hour period.  Please check all medication labels as many medications such as pain and cold medications may contain tylenol.  Do not drink alcohol while taking these medications.  Do not take other NSAID'S while taking ibuprofen (such as aleve or naproxen).  Please take ibuprofen with food to decrease stomach upset.  Please do not soak or submerge your foot as this can lead to infection.  Please keep your foot clean and dry.  You may get it wet after two days with a shower or similar.    If you develop fevers, drainage of thick pus like material, red streaking or have other concerns please seek additional medical care and evaluation.     You may wear the post op shoe for comfort as needed.  Please elevate your foot up above your heart whenever possible.  This will help with the heart rate throbbing type pain as well with ice.

## 2020-01-07 NOTE — ED Provider Notes (Signed)
[facility] EMERGENCY DEPARTMENT Provider Note   CSN: [id number] Arrival date & time: 01/07/20  1747     History Chief Complaint  Patient presents with  . Foot Injury    Anna Potts is a 24 y.o. female with past medical history of obesity, anemia, who presents today for evaluation of pain in his left foot.  She reports that she dropped a dresser on her left foot today with sudden onset pain in her foot.  No numbness.  No other injuries.  Her last tetanus shot was last November.  No interventions tried PTA.   HPI     Past Medical History:  Diagnosis Date  . Dermatographia   . Iron deficiency anemia   . Menstrual periods irregular     Patient Active Problem List   Diagnosis Date Noted  . Obesity (BMI 30-39.9) 04/10/2018  . 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 Acne 12/27/2011  . 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 WISDOM TOOTH EXTRACTION       OB History    Gravida  0   Para      Term      Preterm      AB      Living        SAB      TAB      Ectopic      Multiple      Live Births              Family History  Family history unknown: Yes    Social History   Tobacco Use  . Smoking status: Never Smoker  . Smokeless tobacco: Never Used  Substance Use Topics  . Alcohol use: No  . Drug use: Yes    Types: Marijuana    Home Medications Prior to Admission medications   Medication Sig Start Date End Date Taking? Authorizing Provider  ferrous sulfate (FERROUSUL) 325 (65 FE) MG tablet Take 1 tablet (325 mg total) by mouth 2 (two) times daily. 10/25/12   Leftwich-Kirby, Wilmer Floor, CNM  hydrOXYzine (ATARAX/VISTARIL) 25 MG tablet Take 1 tablet (25 mg total) by mouth 3 (three) times daily as needed. 06/11/19   Willow Ora, MD  metroNIDAZOLE (FLAGYL) 500 MG tablet Take 1 tablet (500 mg total) by mouth 2 (two) times daily. 12/28/19   Lamptey, Britta Mccreedy, MD  naproxen (NAPROSYN)  500 MG tablet Take 1 tablet (500 mg total) by mouth 2 (two) times daily. 12/26/19   Georgetta Haber, NP  Norgestimate-Ethinyl Estradiol Triphasic (ORTHO TRI-CYCLEN LO) 0.18/0.215/0.25 MG-25 MCG tab Take 1 tablet by mouth daily. 11/30/19   Particia Nearing, PA-C  cetirizine (ZYRTEC) 10 MG tablet Take 1 tablet (10 mg total) by mouth daily. Patient not taking: Reported on 06/11/2019 07/29/18 07/10/19  Willow Ora, MD    Allergies    Codeine, Grassleaf sweetflag rhizome, and Nickel  Review of Systems   Review of Systems  Musculoskeletal:       Pain in left foot  Skin:       Discoloration of the nail of the great toe on the left foot  All other systems reviewed and are negative.   Physical Exam Updated Vital Signs BP 119/64   Pulse (!) 55   Temp 98.6 F (37 C)   Resp 18   Ht 5\' 5"  (1.651 m)   Wt 108 kg   SpO2 100%   BMI 39.61 kg/m  Physical Exam Vitals and nursing note reviewed.  Constitutional:      General: She is not in acute distress.    Appearance: She is not ill-appearing.  HENT:     Head: Normocephalic.  Cardiovascular:     Rate and Rhythm: Normal rate.     Pulses: Normal pulses.     Comments: 2+ left foot DP/PT pulse Pulmonary:     Effort: Pulmonary effort is normal. No respiratory distress.  Musculoskeletal:     Comments: Generalized TTP over the superior left foot primarily along the medial aspect.  No crepitus or deformity.   Skin:    Comments: There is subungual hematoma present on the left great toe.  No obvious skin breaks or tears.   Neurological:     Mental Status: She is alert.     Sensory: No sensory deficit.     ED Results / Procedures / Treatments   Labs (all labs ordered are listed, but only abnormal results are displayed) Labs Reviewed - No data to display  EKG None  Radiology DG Foot Complete Left  Result Date: 01/07/2020 CLINICAL DATA:  Left foot injury EXAM: LEFT FOOT - COMPLETE 3+ VIEW COMPARISON:  None. FINDINGS: There is  no evidence of fracture or dislocation. There is no evidence of arthropathy or other focal bone abnormality. Soft tissues are unremarkable. IMPRESSION: Negative. Electronically Signed   By: Jasmine Pang M.D.   On: 01/07/2020 18:25    Procedures .Nail Removal  Date/Time: 01/07/2020 8:35 PM Performed by: Cristina Gong, PA-C Authorized by: Cristina Gong, PA-C   Consent:    Consent obtained:  Verbal   Consent given by:  Patient   Risks discussed:  Bleeding, pain, permanent nail deformity and infection   Alternatives discussed:  No treatment, alternative treatment and referral Location:    Foot:  L big toe Pre-procedure details:    Procedure prep: Cautery used. Anesthesia (see MAR for exact dosages):    Anesthesia method:  None Trephination:    Subungual hematoma drained: yes     Trephination instrument:  Cautery Post-procedure details:    Dressing:  Post-op shoe (Gauze dressing)   Patient tolerance of procedure:  Tolerated well, no immediate complications   (including critical care time)  Medications Ordered in ED Medications  ibuprofen (ADVIL) tablet 600 mg (600 mg Oral Given 01/07/20 2046)    ED Course  I have reviewed the triage vital signs and the nursing notes.  Pertinent labs & imaging results that were available during my care of the patient were reviewed by me and considered in my medical decision making (see chart for details).    MDM Rules/Calculators/A&P                         Patient is a 24 year old woman who presents today for evaluation of an isolated injury of dropping a dresser on her left foot.  X-rays obtained without fracture or other acute abnormality.  On clinical exam she has a subungual hematoma of the left great toe.  Risks and benefits of trephination discussed.  Patient gave informed verbal consent.  Cautery was used to trephinate with immediate drainage of blood.  Patient reports pain improved.    OTC medications for pain, RICE.   Crutches offered, patient declined.  She is given a postop shoe.  Instructions on wound care discussed.  PCP follow-up as needed.  Return precautions were discussed with patient who states their understanding.  At the  time of discharge patient denied any unaddressed complaints or concerns.  Patient is agreeable for discharge home.  Note: Portions of this report may have been transcribed using voice recognition software. Every effort was made to ensure accuracy; however, inadvertent computerized transcription errors may be present   Final Clinical Impression(s) / ED Diagnoses Final diagnoses:  Injury  Left foot pain  Subungual hematoma of great toe of left foot, initial encounter    Rx / DC Orders ED Discharge Orders    None       Norman Clay 01/07/20 2211    Charlynne Pander, MD 01/08/20 1451

## 2020-01-07 NOTE — ED Triage Notes (Signed)
Pt dropped dresser on left foot when moving today. Endorses toe pain.

## 2020-05-22 ENCOUNTER — Telehealth: Payer: Self-pay

## 2020-05-22 NOTE — Telephone Encounter (Signed)
Nurse Assessment Nurse: Tollie Eth, RN, Dois Davenport Date/Time Lamount Cohen Time): 05/19/2020 6:58:53 PM Confirm and document reason for call. If symptomatic, describe symptoms. ---Caller needs to make an appt. Pt says she took her last pill yesterday Visteril . She currently in hives and she does not know why. Does the patient have any new or worsening symptoms? ---Yes Will a triage be completed? ---Yes Related visit to physician within the last 2 weeks? ---No Does the PT have any chronic conditions? (i.e. diabetes, asthma, this includes High risk factors for pregnancy, etc.) ---No Is the patient pregnant or possibly pregnant? (Ask all females between the ages of 67-55) ---No Is this a behavioral health or substance abuse call? ---No Guidelines Guideline Title Affirmed Question Affirmed Notes Nurse Date/Time (Eastern Time) Hives [1] Hives has occurred 3 or more times in the last year AND [2] the cause was not found Tollie Eth, RN, Dois Davenport 05/19/2020 7:00:14 PM Disp. Time Lamount Cohen Time) Disposition Final User 05/19/2020 7:05:46 PM SEE PCP WITHIN 3 DAYS Yes Plummer, RN, Dois Davenport PLEASE NOTE: All timestamps contained within this report are represented as [place] Standard Time. CONFIDENTIALTY NOTICE: This fax transmission is intended only for the addressee. It contains information that is legally privileged, confidential or otherwise protected from use or disclosure. If you are not the intended recipient, you are strictly prohibited from reviewing, disclosing, copying using or disseminating any of this information or taking any action in reliance on or regarding this information. If you have received this fax in error, please notify us immediately by telephone so that we can arrange for its return to [place]. Phone: [phone], Toll-Free: [phone], Fax: [phone] Page: 2 of 2 Call Id: [id number] Caller Disagree/Comply Comply Caller Understands Yes PreDisposition Did not know what to do Care Advice Given  Per Guideline SEE PCP WITHIN 3 DAYS: ANTIHISTAMINE MEDICINES FOR WIDESPREAD HIVES: * For widespread hives, take either cetirizine or loratadine. They can help reduce the rash, itching, and other allergic symptoms. COOL BATH FOR ITCHING: * Take a cool bath for 10 minutes to relieve itching. (Caution: avoid any chill). PREVENTION - REMOVE ALLERGENS: * Take a bath or shower if triggered by pollens or animal contact. CALL BACK IF: * Severe hives or severe itching persist over 24 hours despite taking an antihistamine (e.g., Benadryl) * You become worse CARE ADVICE given per Hives

## 2020-05-29 ENCOUNTER — Other Ambulatory Visit: Payer: Self-pay

## 2020-05-29 ENCOUNTER — Emergency Department (HOSPITAL_COMMUNITY)
Admission: EM | Admit: 2020-05-29 | Discharge: 2020-05-29 | Disposition: A | Discharge status: Home / Self Care | Payer: Self-pay | Attending: Emergency Medicine | Admitting: Emergency Medicine

## 2020-05-29 DIAGNOSIS — L500 Allergic urticaria: Secondary | ICD-10-CM | POA: Insufficient documentation

## 2020-05-29 DIAGNOSIS — L609 Nail disorder, unspecified: Secondary | ICD-10-CM | POA: Insufficient documentation

## 2020-05-29 MED ORDER — HYDROXYZINE HCL 25 MG PO TABS
25.0000 mg | ORAL_TABLET | Freq: Once | ORAL | Status: AC
  Administered 2020-05-29: 25 mg via ORAL
  Filled 2020-05-29: qty 1

## 2020-05-29 NOTE — ED Triage Notes (Signed)
Pt dropped her dresser on her left big toe like a few months ago and her big toe nail is hanging off and wants someone to cut it off.

## 2020-05-29 NOTE — ED Provider Notes (Signed)
[facility] EMERGENCY DEPARTMENT Provider Note   CSN: [id number] Arrival date & time: 05/29/20  0119     History Chief Complaint  Patient presents with  . Foot Injury    Anna Potts is a 25 y.o. female.  The history is provided by the patient and medical records.  Foot Injury   25 y.o. F here with toenail problem.  States she dropped a Child psychotherapist on her right big toe a few months ago and nail started to turn somewhat black.  States now the inside corner of it has slightly lifted up and is getting caught on her socks/shoes.  States she was not sure if this needed to be removed.  She is planning to go to the beach and mother was concerned if removed it would get infected.  She has no history of DM.  Since arrival in ED, patient has developed hives.  This is a chronic issue for her, takes hydroxyzine for this but missed evening dose since she came to ED.  Past Medical History:  Diagnosis Date  . Dermatographia   . Iron deficiency anemia   . Menstrual periods irregular     Patient Active Problem List   Diagnosis Date Noted  . Obesity (BMI 30-39.9) 04/10/2018  . 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 Acne 12/27/2011  . 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 WISDOM TOOTH EXTRACTION       OB History    Gravida  0   Para      Term      Preterm      AB      Living        SAB      IAB      Ectopic      Multiple      Live Births              Family History  Family history unknown: Yes    Social History   Tobacco Use  . Smoking status: Never Smoker  . Smokeless tobacco: Never Used  Substance Use Topics  . Alcohol use: No  . Drug use: Yes    Types: Marijuana    Home Medications Prior to Admission medications   Medication Sig Start Date End Date Taking? Authorizing Provider  Famotidine (PEPCID PO) Take 1 tablet by mouth in the morning and at bedtime.   Yes [provider]  ferrous sulfate (FERROUSUL) 325 (65 FE) MG tablet Take 1 tablet (325 mg total) by mouth 2 (two) times daily. 10/25/12  Yes Leftwich-Kirby, Wilmer Floor, CNM  hydrOXYzine (ATARAX/VISTARIL) 25 MG tablet Take 1 tablet (25 mg total) by mouth 3 (three) times daily as needed. Patient taking differently: Take 25 mg by mouth 3 (three) times daily as needed for anxiety. 06/11/19  Yes Willow Ora, MD  Norgestimate-Ethinyl Estradiol Triphasic (ORTHO TRI-CYCLEN LO) 0.18/0.215/0.25 MG-25 MCG tab Take 1 tablet by mouth daily. 11/30/19  Yes Particia Nearing, PA-C  metroNIDAZOLE (FLAGYL) 500 MG tablet Take 1 tablet (500 mg total) by mouth 2 (two) times daily. Patient not taking: No sig reported 12/28/19   Merrilee Jansky, MD  naproxen (NAPROSYN) 500 MG tablet Take 1 tablet (500 mg total) by mouth 2 (two) times daily. Patient not taking: No sig reported 12/26/19   Linus Mako B, NP  cetirizine (ZYRTEC) 10 MG tablet Take 1 tablet (10 mg total) by mouth daily. Patient not taking: Reported on  06/11/2019 07/29/18 07/10/19  Willow Ora, MD    Allergies    Codeine, Grassleaf sweetflag rhizome, and Nickel  Review of Systems   Review of Systems  Skin: Positive for wound.  All other systems reviewed and are negative.   Physical Exam Updated Vital Signs BP 126/75   Pulse (!) 53   Temp 97.7 F (36.5 C) (Oral)   Resp 18   Ht 5\' 5"  (1.651 m)   Wt 108.9 kg   SpO2 100%   BMI 39.94 kg/m   Physical Exam Vitals and nursing note reviewed.  Constitutional:      Appearance: She is well-developed.  HENT:     Head: Normocephalic and atraumatic.  Eyes:     Conjunctiva/sclera: Conjunctivae normal.     Pupils: Pupils are equal, round, and reactive to light.  Cardiovascular:     Rate and Rhythm: Normal rate and regular rhythm.     Heart sounds: Normal heart sounds.  Pulmonary:     Effort: Pulmonary effort is normal.     Breath sounds: Normal breath sounds.  Abdominal:     General: Bowel  sounds are normal.     Palpations: Abdomen is soft.  Musculoskeletal:        General: Normal range of motion.     Cervical back: Normal range of motion.     Comments: Right great toenail is discolored and somewhat black, proximal medial corner is slightly uplifted from nailbed but otherwise remains strongly adhered, new nail growth is present, good distal cap refill of the toe, normal sensation No drainage, bleeding, no redness/cellulitic changes to the toe  Skin:    General: Skin is warm and dry.     Findings: Rash present. Rash is urticarial.     Comments: Faint hives to right thigh, no edema  Neurological:     Mental Status: She is alert and oriented to person, place, and time.     ED Results / Procedures / Treatments   Labs (all labs ordered are listed, but only abnormal results are displayed) Labs Reviewed - No data to display  EKG None  Radiology No results found.  Procedures Procedures   Medications Ordered in ED Medications  hydrOXYzine (ATARAX/VISTARIL) tablet 25 mg (25 mg Oral Given 05/29/20 0509)    ED Course  I have reviewed the triage vital signs and the nursing notes.  Pertinent labs & imaging results that were available during my care of the patient were reviewed by me and considered in my medical decision making (see chart for details).    MDM Rules/Calculators/A&P  25 y.o. F here with toenail problem.  Dropped dresser on her toenail a few months ago, now toenail is starting to uplift from medial corner of the nailbed but otherwise is strongly adhered. Nail is partially discolored. There is no redness, swelling, purulent drainage of the toe.  Foot is NVI.  At this point, nail overall is well adhered except for the proximal medial corner of the nail.  Do not feel it would be wise to remove nail due to infection risk, exposed nail bed, etc.  Patient has been instructed how to bandage nail to hold it down/protect nail.  There is new nail growth present so suspect  damaged nail will eventually fall off once new nail grows out further.  She can follow-up with PCP.  Return here for new concerns.  Final Clinical Impression(s) / ED Diagnoses Final diagnoses:  Nail problem    Rx / DC Orders ED  Discharge Orders    None       Garlon Hatchet, PA-C 05/29/20 0535    Zadie Rhine, MD 05/29/20 [phone]

## 2020-05-29 NOTE — ED Notes (Signed)
All appropriate discharge materials reviewed at length with patient. Time for questions provided. Pt has no other questions at this time and verbalizes understanding of all provided materials.  

## 2020-05-29 NOTE — Discharge Instructions (Addendum)
You can bandage toe as I showed you here to keep nail held down. Nail likely will eventually pop off once nail underneath gets long enough but I would try not to pull it off to protect the nailbed and reduce infection risk. Can follow-up with your primary care doctor. Return here for new concerns.

## 2020-09-10 ENCOUNTER — Encounter (HOSPITAL_COMMUNITY): Payer: Self-pay | Admitting: *Deleted

## 2020-09-10 ENCOUNTER — Ambulatory Visit (HOSPITAL_COMMUNITY)
Admission: EM | Admit: 2020-09-10 | Discharge: 2020-09-10 | Disposition: A | Discharge status: Home / Self Care | Payer: Self-pay | Attending: Student | Admitting: Student

## 2020-09-10 ENCOUNTER — Other Ambulatory Visit: Payer: Self-pay

## 2020-09-10 DIAGNOSIS — H66001 Acute suppurative otitis media without spontaneous rupture of ear drum, right ear: Secondary | ICD-10-CM

## 2020-09-10 DIAGNOSIS — J301 Allergic rhinitis due to pollen: Secondary | ICD-10-CM

## 2020-09-10 DIAGNOSIS — Z3202 Encounter for pregnancy test, result negative: Secondary | ICD-10-CM

## 2020-09-10 DIAGNOSIS — N926 Irregular menstruation, unspecified: Secondary | ICD-10-CM

## 2020-09-10 LAB — POC URINE PREG, ED: Preg Test, Ur: NEGATIVE

## 2020-09-10 MED ORDER — CETIRIZINE HCL 10 MG PO TABS: 10.0000 mg | ORAL_TABLET | Freq: Every day | ORAL | 2 refills | Status: DC

## 2020-09-10 MED ORDER — AMOXICILLIN 875 MG PO TABS: 875.0000 mg | ORAL_TABLET | Freq: Two times a day (BID) | ORAL | 0 refills | Status: AC

## 2020-09-10 NOTE — ED Triage Notes (Signed)
Pt reports Rt facial swelling and RT ear pain that started 3 days ago.

## 2020-09-10 NOTE — ED Provider Notes (Signed)
[facility]    CSN: [id number] Arrival date & time: 09/10/20  1503      History   Chief Complaint Chief Complaint  Patient presents with   Otalgia    RT   Facial Swelling    HPI Anna Potts is a 25 y.o. female presenting with R ear pain x3 days. Medical history allergic rhinitis that is currently poorly treated on no medications.  Also with irregular periods, she is requesting a pregnancy test.  Endorses 3 days of right ear pain, throbbing and sharp, with radiation of the pain to her neck and outer ear.  Muffled hearing, sensation of room spinning when she goes from sitting to standing.  Denies fever/chills, recent URI, cough, chest pain, shortness of breath, weakness.   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 Authorizing Provider  amoxicillin (AMOXIL) 875 MG tablet Take 1 tablet (875 mg total) by mouth 2 (two) times daily for 7 days. 09/10/20 09/17/20 Yes Rhys Martini, PA-C  cetirizine (ALLERGY, CETIRIZINE,) 10 MG tablet Take 1 tablet (10 mg total) by mouth daily. 09/10/20  Yes Rhys Martini, PA-C  Famotidine (PEPCID PO) Take 1 tablet by mouth in the morning and at bedtime.    [provider]  ferrous sulfate (FERROUSUL) 325 (65 FE) MG tablet Take 1 tablet (325 mg total) by mouth 2 (two) times daily. 10/25/12   Leftwich-Kirby,  Wilmer Floor, CNM  hydrOXYzine (ATARAX/VISTARIL) 25 MG tablet Take 1 tablet (25 mg total) by mouth 3 (three) times daily as needed. Patient taking differently: Take 25 mg by mouth 3 (three) times daily as needed for anxiety. 06/11/19   Willow Ora, MD  metroNIDAZOLE (FLAGYL) 500 MG tablet Take 1 tablet (500 mg total) by mouth 2 (two) times daily. Patient not taking: No sig reported 12/28/19   Merrilee Jansky, MD  naproxen (NAPROSYN) 500 MG tablet Take 1 tablet (500 mg total) by mouth 2 (two) times daily. Patient not taking: No sig reported 12/26/19   Georgetta Haber, NP  Norgestimate-Ethinyl Estradiol Triphasic (ORTHO TRI-CYCLEN LO) 0.18/0.215/0.25 MG-25 MCG tab Take 1 tablet by mouth daily. 11/30/19   Particia Nearing, PA-C    Family History Family History  Family history unknown: Yes    Social History Social History   Tobacco Use   Smoking status: Never   Smokeless tobacco: Never  Substance Use Topics   Alcohol use: No   Drug use: Yes    Types: Marijuana  Allergies   Codeine, Grassleaf sweetflag rhizome, and Nickel   Review of Systems Review of Systems  Constitutional:  Negative for appetite change, chills and fever.  HENT:  Positive for ear pain. Negative for congestion, rhinorrhea, sinus pressure, sinus pain and sore throat.   Eyes:  Negative for redness and visual disturbance.  Respiratory:  Negative for cough, chest tightness, shortness of breath and wheezing.   Cardiovascular:  Negative for chest pain and palpitations.  Gastrointestinal:  Negative for abdominal pain, constipation, diarrhea, nausea and vomiting.  Genitourinary:  Negative for dysuria, frequency and urgency.  Musculoskeletal:  Negative for myalgias.  Neurological:  Negative for dizziness, weakness and headaches.  Psychiatric/Behavioral:  Negative for confusion.   All other systems reviewed and are negative.   Physical Exam Triage Vital Signs ED Triage Vitals  Enc Vitals Group     BP  09/10/20 1544 128/69     Pulse Rate 09/10/20 1544 74     Resp 09/10/20 1544 20     Temp 09/10/20 1544 99.4 F (37.4 C)     Temp src --      SpO2 09/10/20 1544 100 %     Weight --      Height --      Head Circumference --      Peak Flow --      Pain Score 09/10/20 1545 7     Pain Loc --      Pain Edu? --      Excl. in GC? --    No data found.  Updated Vital Signs BP 128/69   Pulse 74   Temp 99.4 F (37.4 C)   Resp 20   LMP 09/10/2020   SpO2 100%   Visual Acuity Right Eye Distance:   Left Eye Distance:   Bilateral Distance:    Right Eye Near:   Left Eye Near:    Bilateral Near:     Physical Exam Vitals reviewed.  Constitutional:      Appearance: Normal appearance. She is not ill-appearing.  HENT:     Head: Normocephalic and atraumatic.     Right Ear: Hearing, ear canal and external ear normal. Tenderness present. No swelling. No middle ear effusion. There is no impacted cerumen. No mastoid tenderness. Tympanic membrane is erythematous and bulging. Tympanic membrane is not injected, scarred, perforated or retracted.     Left Ear: Hearing, tympanic membrane, ear canal and external ear normal. No swelling or tenderness.  No middle ear effusion. There is no impacted cerumen. No mastoid tenderness. Tympanic membrane is not injected, scarred, perforated, erythematous, retracted or bulging.     Ears:     Comments: R tragus tenderness and superficial cervical lymphadenopathy    Mouth/Throat:     Pharynx: Oropharynx is clear. No oropharyngeal exudate or posterior oropharyngeal erythema.  Cardiovascular:     Rate and Rhythm: Normal rate and regular rhythm.     Heart sounds: Normal heart sounds.  Pulmonary:     Effort: Pulmonary effort is normal.     Breath sounds: Normal breath sounds.  Lymphadenopathy:     Cervical: Cervical adenopathy present.     Right cervical: Superficial cervical adenopathy present.  Neurological:     General: No focal deficit present.     Mental  Status: She is alert and oriented to person, place, and time.  Psychiatric:        Mood and Affect: Mood normal.        Behavior: Behavior normal.  Thought Content: Thought content normal.        Judgment: Judgment normal.     UC Treatments / Results  Labs (all labs ordered are listed, but only abnormal results are displayed) Labs Reviewed  POC URINE PREG, ED    EKG   Radiology No results found.  Procedures Procedures (including critical care time)  Medications Ordered in UC Medications - No data to display  Initial Impression / Assessment and Plan / UC Course  I have reviewed the triage vital signs and the nursing notes.  Pertinent labs & imaging results that were available during my care of the patient were reviewed by me and considered in my medical decision making (see chart for details).     This patient is a very pleasant 25 y.o. year old female presenting with R AOM related to untreated allergic rhintis. Afebrile, nontachy. Amoxicillin, zyrtec. Negative pregnancy test-patient requested this due to irregular periods.ED return precautions discussed. Patient verbalizes understanding and agreement.     Final Clinical Impressions(s) / UC Diagnoses   Final diagnoses:  Non-recurrent acute suppurative otitis media of right ear without spontaneous rupture of tympanic membrane  Seasonal allergic rhinitis due to pollen  Negative pregnancy test  Irregular periods     Discharge Instructions      -Start the antibiotic-Amoxicillin, 1 pill every 12 hours for 7 days.  You can take this with food like with breakfast and dinner. -Zyrtec (Cetirizine) once daily for at least 1 week, continue for longer if it's helping -Your pregnancy test is negative     ED Prescriptions     Medication Sig Dispense Auth. Provider   amoxicillin (AMOXIL) 875 MG tablet Take 1 tablet (875 mg total) by mouth 2 (two) times daily for 7 days. 14 tablet Ignacia Bayley E, PA-C   cetirizine  (ALLERGY, CETIRIZINE,) 10 MG tablet Take 1 tablet (10 mg total) by mouth daily. 30 tablet Rhys Martini, PA-C      PDMP not reviewed this encounter.   Rhys Martini, PA-C 09/10/20 1636

## 2020-09-10 NOTE — Discharge Instructions (Addendum)
-  Start the antibiotic-Amoxicillin, 1 pill every 12 hours for 7 days.  You can take this with food like with breakfast and dinner. -Zyrtec (Cetirizine) once daily for at least 1 week, continue for longer if it's helping -Your pregnancy test is negative

## 2021-01-07 ENCOUNTER — Encounter (HOSPITAL_COMMUNITY): Payer: Self-pay | Admitting: Emergency Medicine

## 2021-01-07 ENCOUNTER — Ambulatory Visit (HOSPITAL_COMMUNITY)
Admission: EM | Admit: 2021-01-07 | Discharge: 2021-01-07 | Disposition: A | Discharge status: Home / Self Care | Payer: Self-pay | Attending: Emergency Medicine | Admitting: Emergency Medicine

## 2021-01-07 DIAGNOSIS — L500 Allergic urticaria: Secondary | ICD-10-CM | POA: Insufficient documentation

## 2021-01-07 DIAGNOSIS — N926 Irregular menstruation, unspecified: Secondary | ICD-10-CM | POA: Insufficient documentation

## 2021-01-07 LAB — POCT URINALYSIS DIPSTICK, ED / UC
Bilirubin Urine: NEGATIVE
Glucose, UA: NEGATIVE mg/dL
Hgb urine dipstick: NEGATIVE
Ketones, ur: NEGATIVE mg/dL
Leukocytes,Ua: NEGATIVE
Nitrite: NEGATIVE
Protein, ur: NEGATIVE mg/dL
Specific Gravity, Urine: 1.02 (ref 1.005–1.030)
Urobilinogen, UA: 0.2 mg/dL (ref 0.0–1.0)
pH: 6 (ref 5.0–8.0)

## 2021-01-07 LAB — POC URINE PREG, ED: Preg Test, Ur: NEGATIVE

## 2021-01-07 MED ORDER — HYDROXYZINE HCL 25 MG PO TABS
25.0000 mg | ORAL_TABLET | Freq: Three times a day (TID) | ORAL | 0 refills | Status: AC | PRN
Start: 2021-01-07 — End: 2021-02-06

## 2021-01-07 NOTE — ED Triage Notes (Signed)
Pt presents for STD & pregnancy testing. States also needs a refill of hydroxyzine.

## 2021-01-07 NOTE — ED Provider Notes (Signed)
[place]    CSN: [id number] Arrival date & time: 01/07/21  1717      History   Chief Complaint Chief Complaint  Patient presents with   Urticaria   SEXUALLY TRANSMITTED DISEASE   Possible Pregnancy    HPI Anna Potts is a 25 y.o. female.   25 year old female Anna Potts, presents to urgent care with chief complaint of urticaria.  Patient states that she needs a refill on hydroxyzine/atarax.  Patient also requesting pregnancy testing and STI testing.  Patient denies signs and symptoms of STI just wants to get it checked out.  Patient reports last menstrual period was November 14 2020.  No new sexual partners.  The history is provided by the patient. No language interpreter was used.   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 Authorizing Provider  cetirizine (ALLERGY, CETIRIZINE,) 10 MG tablet Take 1 tablet (10 mg total) by mouth daily. 09/10/20   Hazel Sams, PA-C  Famotidine (PEPCID PO) Take 1 tablet by mouth in the morning and at bedtime.    [provider]  ferrous sulfate (FERROUSUL) 325 (65 FE) MG tablet Take 1 tablet (325 mg total) by mouth 2 (two) times daily. 10/25/12   Leftwich-Kirby, Kathie Dike, CNM  hydrOXYzine (ATARAX/VISTARIL) 25 MG tablet  Take 1 tablet (25 mg total) by mouth 3 (three) times daily as needed. [id number]  Khiem Gargis, Jeanett Schlein, NP  metroNIDAZOLE (FLAGYL) 500 MG tablet Take 1 tablet (500 mg total) by mouth 2 (two) times daily. Patient not taking: No sig reported 12/28/19   Chase Picket, MD  naproxen (NAPROSYN) 500 MG tablet Take 1 tablet (500 mg total) by mouth 2 (two) times daily. Patient not taking: No sig reported 12/26/19   Zigmund Gottron, NP  Norgestimate-Ethinyl Estradiol Triphasic (ORTHO TRI-CYCLEN LO) 0.18/0.215/0.25 MG-25 MCG tab Take 1 tablet by mouth daily. 11/30/19   Volney American, PA-C    Family History Family History  Family history unknown: Yes    Social History Social History   Tobacco Use   Smoking status: Never   Smokeless tobacco: Never  Substance Use Topics   Alcohol use: No   Drug use: Yes    Types: Marijuana     Allergies   Codeine, Grassleaf sweetflag rhizome, and Nickel   Review of Systems Review of Systems  Constitutional:  Negative for fever.  Gastrointestinal:  Negative for abdominal pain.  Genitourinary:  Positive for menstrual  problem. Negative for dysuria, vaginal bleeding and vaginal discharge.  Skin:        Hx of urticaria, currently no rash  All other systems reviewed and are negative.   Physical Exam Triage Vital Signs ED Triage Vitals  Enc Vitals Group     BP 01/07/21 1806 123/81     Pulse Rate 01/07/21 1806 73     Resp 01/07/21 1806 16     Temp 01/07/21 1806 98.3 F (36.8 C)     Temp Source 01/07/21 1806 Oral     SpO2 01/07/21 1806 98 %     Weight --      Height --      Head Circumference --      Peak Flow --      Pain Score 01/07/21 1805 0     Pain Loc --      Pain Edu? --      Excl. in [place]? --    No data found.  Updated Vital Signs BP 123/81 (BP Location: Right Arm)   Pulse 73   Temp 98.3 F (36.8 C) (Oral)   Resp 16   LMP 11/14/2020   SpO2 98%   Visual Acuity Right Eye Distance:   Left Eye Distance:    Bilateral Distance:    Right Eye Near:   Left Eye Near:    Bilateral Near:     Physical Exam Vitals and nursing note reviewed.  Constitutional:      General: She is not in acute distress.    Appearance: She is well-developed and well-groomed.  HENT:     Head: Normocephalic and atraumatic.  Eyes:     Conjunctiva/sclera: Conjunctivae normal.  Cardiovascular:     Rate and Rhythm: Normal rate and regular rhythm.     Heart sounds: No murmur heard. Pulmonary:     Effort: Pulmonary effort is normal. No respiratory distress.     Breath sounds: Normal breath sounds.  Abdominal:     Palpations: Abdomen is soft.     Tenderness: There is no abdominal tenderness.  Musculoskeletal:     Cervical back: Neck supple.  Skin:    General: Skin is warm and dry.     Capillary Refill: Capillary refill takes less than 2 seconds.     Findings: No rash.  Neurological:     General: No focal deficit present.     Mental Status: She is alert and oriented to person, place, and time.     GCS: GCS eye subscore is 4. GCS verbal subscore is 5. GCS motor subscore is 6.  Psychiatric:        Attention and Perception: Attention normal.        Mood and Affect: Mood is anxious.        Speech: Speech normal.        Behavior: Behavior normal. Behavior is cooperative.     UC Treatments / Results  Labs (all labs ordered are listed, but only abnormal results are displayed) Labs Reviewed  POC URINE PREG, ED  POCT URINALYSIS DIPSTICK, ED / UC  CERVICOVAGINAL ANCILLARY ONLY    EKG   Radiology No results found.  Procedures Procedures (including critical care time)  Medications Ordered in UC Medications - No data to display  Initial Impression / Assessment and Plan / UC Course  I have reviewed the triage vital signs and the nursing notes.  Pertinent labs & imaging results that were available during my care of the patient were reviewed by me  and considered in my medical decision making (see chart for  details).     Ddx: Medication refill, urticaria, irregular periods, smoker Final Clinical Impressions(s) / UC Diagnoses   Final diagnoses:  Allergic urticaria  Irregular periods/menstrual cycles     Discharge Instructions      Your pregnancy test was negative, UA was negative, STI tests are pending. Please go to local health department for regular STI testing. We have refilled your atarax, please keep appt with allergist to determine cause of urticaria. Please follow up with PCP or gyn for evaluation of irregular periods.Stop smoking marijuana, if you are trying to get pregnant take Prenatal vitamins daily.       ED Prescriptions     Medication Sig Dispense Auth. Provider   hydrOXYzine (ATARAX/VISTARIL) 25 MG tablet Take 1 tablet (25 mg total) by mouth 3 (three) times daily as needed. 90 tablet Latrail Pounders, Para March, NP      PDMP not reviewed this encounter.   Clancy Gourd, NP 01/07/21 1900

## 2021-01-07 NOTE — Discharge Instructions (Addendum)
Your pregnancy test was negative, UA was negative, STI tests are pending. Please go to local health department for regular STI testing. We have refilled your atarax, please keep appt with allergist to determine cause of urticaria. Please follow up with PCP or gyn for evaluation of irregular periods.Stop smoking marijuana, if you are trying to get pregnant take Prenatal vitamins daily.

## 2021-01-08 LAB — CERVICOVAGINAL ANCILLARY ONLY
Bacterial Vaginitis (gardnerella): POSITIVE — AB
Candida Glabrata: NEGATIVE
Candida Vaginitis: NEGATIVE
Chlamydia: NEGATIVE
Comment: NEGATIVE
Comment: NEGATIVE
Comment: NEGATIVE
Comment: NEGATIVE
Comment: NEGATIVE
Comment: NORMAL
Neisseria Gonorrhea: NEGATIVE
Trichomonas: NEGATIVE

## 2021-01-09 ENCOUNTER — Telehealth (HOSPITAL_COMMUNITY): Payer: Self-pay | Admitting: Emergency Medicine

## 2021-01-09 MED ORDER — METRONIDAZOLE 500 MG PO TABS: 500.0000 mg | ORAL_TABLET | Freq: Two times a day (BID) | ORAL | 0 refills | Status: DC

## 2021-04-18 NOTE — Progress Notes (Signed)
New Patient Note  RE: Anna Potts MRN: [id number] DOB: 1995/08/11 Date of Office Visit: 04/19/2021  Consult requested by: Leamon Arnt, MD Primary care provider: Leamon Arnt, MD  Chief Complaint: Urticaria (Note sure the cause it appears 2 days at a time. Hydroxyzine helps  )  History of Present Illness: I had the pleasure of seeing Anna Potts for initial evaluation at the [facility] on 04/19/2021. She is a 26 y.o. female, who is referred here by Leamon Arnt, MD for the evaluation of hives.  Rash started about 3 years ago. This can occur anywhere on her body. Describes them as itchy, raised, red. Individual rashes lasts about 10-20 minutes. No ecchymosis upon resolution. Associated symptoms include: one episode of shortness of breath.  Frequency of episodes: every 2 days if not taking hydroxyzine but it makes her drowsy. Suspected triggers are unknown but worse with pressure. Denies any fevers, chills, changes in medications, foods, personal care products or recent infections. She has tried the following therapies: hydroxyzine 25mg  prn with good benefit.  Tried famotidine with no benefit. Systemic steroids no. Currently on hydroxyzine prn.  Previous work up includes: none. Previous history of rash/hives: no. Patient is up to date with the following cancer screening tests: physical exam.  01/07/2021 UC visit: "26 year old female Anna Potts, presents to urgent care with chief complaint of urticaria.  Patient states that she needs a refill on hydroxyzine/atarax.  Patient also requesting pregnancy testing and STI testing.  Patient denies signs and symptoms of STI just wants to get it checked out.  Patient reports last menstrual period was November 14 2020.  No new sexual partners."  Assessment and Plan: [name] is a 26 y.o. female with: Chronic idiopathic urticaria Started to break out in itchy rash 3 years ago. This can occur anywhere on her body lasting  10-20 minutes at a time. One episode of shortness of breath. Hydroxyzine helps but causes drowsiness. Tried low dose zyrtec and famotidine with no benefit. Denies changes in diet, meds, personal care products. Concerned about allergic triggers.  Based on clinical history, she likely has chronic idiopathic urticaria. Discussed with patient, that urticaria is usually caused by release of histamine by cutaneous mast cells but sometimes it is non-histamine mediated. Explained that urticaria is not always associated with allergies. In most cases, the exact etiology for urticaria can not be established and it is considered idiopathic. No indication for any allergy testing today.  Start Allegra (fexofenadine) 180mg  twice a day. If symptoms are not controlled or causes drowsiness let us know. Start Pepcid (famotidine) 20mg  twice a day.  May take hydroxyzine 10mg  (1-2 tablets) every 8 hours as needed for breakthrough hives. Avoid the following potential triggers: alcohol, tight clothing, NSAIDs, hot showers and getting overheated. Get bloodwork if hives not controlled.  Read about Xolair. [email or web address]  Return in about 2 months (around 06/17/2021).  Meds ordered this encounter  Medications   hydrOXYzine (ATARAX) 10 MG tablet    Sig: Take 1-2 tablets as needed every 8 hours for breakthrough hives.    Dispense:  90 tablet    Refill:  1   Lab Orders         Alpha-Gal Panel         ANA w/Reflex         Chronic Urticaria         CBC with Differential/Platelet         Comprehensive metabolic panel  C-reactive protein         Sedimentation rate         Thyroid Cascade Profile         Tryptase      Other allergy screening: Asthma: no Rhino conjunctivitis:  Sneezing, rhinorrhea with pollen.  Food allergy: no Medication allergy: no Hymenoptera allergy: no Eczema:no History of recurrent infections suggestive of immunodeficency: no  Diagnostics: None.   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 60 tablet 1   hydrOXYzine (ATARAX) 10 MG tablet Take 1-2 tablets as needed every 8 hours for breakthrough hives. 90 tablet 1   No current facility-administered medications for this visit.   Allergies: Allergies  Allergen Reactions   Codeine Itching   Grassleaf Sweetflag Rhizome Other (See Comments)    sneezing   Nickel Other (See Comments)    Eats away skin    Social History: Social History   Socioeconomic History   Marital status: Single    Spouse name: Not on file   Number of children: Not on file   Years of education: Not on file   Highest education level: Not on file  Occupational History   Not on file  Tobacco Use   Smoking status: Never   Smokeless tobacco: Never  Substance and Sexual Activity   Alcohol use: No   Drug use: Yes    Types: Marijuana   Sexual activity: Never    Birth control/protection: Pill  Other Topics Concern   Not on file  Social History Narrative   Not on file   Social Determinants of Health   Financial Resource Strain: Not on file  Food Insecurity: Not on file  Transportation Needs: Not on file  Physical Activity: Not on file  Stress: Not on file  Social Connections: Not on file   Lives in an  apartment. Smoking: vapes Occupation: behavior specialist.  Environmental History: Water Damage/mildew in the house: no Carpet in the family room: no Carpet in the bedroom: yes Heating: gas Cooling: central Pet: yes 1 cat  x 2 yrs  Family History: Family History  Family history unknown: Yes   Problem                               Relation Asthma                                   Sister  Eczema                                Sister  Food allergy  no Allergic rhino conjunctivitis     no  Review of Systems  Constitutional:  Negative for appetite change, chills, fever and unexpected weight change.  HENT:  Negative for congestion and rhinorrhea.   Eyes:  Negative for itching.  Respiratory:  Negative for cough, chest tightness, shortness of breath and wheezing.   Cardiovascular:  Negative for chest pain.  Gastrointestinal:  Negative for abdominal pain.  Genitourinary:  Negative for difficulty urinating.  Skin:  Positive for rash.  Neurological:  Negative for headaches.   Objective: BP 116/78    Pulse 75    Temp 97.9 F (36.6 C)    Resp 20    Ht 5\' 6"  (1.676 m)    Wt 250 lb 12.8 oz (113.8 kg)    SpO2 99%    BMI 40.48 kg/m  Body mass index is 40.48 kg/m. Physical Exam Vitals and nursing note reviewed.  Constitutional:      Appearance: Normal appearance. She is well-developed.  HENT:     Head: Normocephalic and atraumatic.     Right Ear: Tympanic membrane and external ear normal.     Left Ear: Tympanic membrane and external ear normal.     Nose: Nose normal.     Mouth/Throat:     Mouth: Mucous membranes are moist.     Pharynx: Oropharynx is clear.  Eyes:     Conjunctiva/sclera: Conjunctivae normal.  Cardiovascular:     Rate and Rhythm: Normal rate and regular rhythm.     Heart sounds: Normal heart sounds. No murmur heard.   No friction rub. No gallop.  Pulmonary:     Effort: Pulmonary effort is normal.     Breath sounds: Normal breath sounds.  No wheezing, rhonchi or rales.  Musculoskeletal:     Cervical back: Neck supple.  Skin:    General: Skin is warm.     Findings: No rash.     Comments: No dermatographism on exam.  Neurological:     Mental Status: She is alert and oriented to person, place, and time.  Psychiatric:        Behavior: Behavior normal.  The plan was reviewed with the patient/family, and all questions/concerned were addressed.  It was my pleasure to see Moni today and participate in her care. Please feel free to contact me with any questions or concerns.  Sincerely,  Rexene Alberts, DO Allergy & Immunology  [facility] office: [place] office: [phone]

## 2021-04-19 ENCOUNTER — Other Ambulatory Visit: Payer: Self-pay

## 2021-04-19 ENCOUNTER — Ambulatory Visit (INDEPENDENT_AMBULATORY_CARE_PROVIDER_SITE_OTHER): Payer: Self-pay | Admitting: Allergy

## 2021-04-19 ENCOUNTER — Encounter: Payer: Self-pay | Admitting: Allergy

## 2021-04-19 VITALS — BP 116/78 | HR 75 | Temp 97.9°F | Resp 20 | Ht 66.0 in | Wt 250.8 lb

## 2021-04-19 DIAGNOSIS — L501 Idiopathic urticaria: Secondary | ICD-10-CM

## 2021-04-19 MED ORDER — HYDROXYZINE HCL 10 MG PO TABS: ORAL_TABLET | ORAL | 1 refills | Status: AC

## 2021-04-19 NOTE — Patient Instructions (Addendum)
Hives: Based on clinical history, she likely has chronic idiopathic urticaria. Discussed with patient, that urticaria is usually caused by release of histamine by cutaneous mast cells but sometimes it is non-histamine mediated. Explained that urticaria is not always associated with allergies. In most cases, the exact etiology for urticaria can not be established and it is considered idiopathic. No indication for any allergy testing today.   Start Allegra (fexofenadine) 180mg  twice a day. If symptoms are not controlled or causes drowsiness let know. Start pepcid (famotidine) 20mg  twice a day.  May take hydroxyzine 10mg  every 8 hours as needed for breakthrough hives.  Avoid the following potential triggers: alcohol, tight clothing, NSAIDs, hot showers and getting overheated. Get bloodwork if hives not controlled.  We are ordering labs, so please allow 1-2 weeks for the results to come back. With the newly implemented Cures Act, the labs might be visible to you at the same time that they become visible to me. However, I will not address the results until all of the results are back, so please be patient.  Read about [email or web address]. [place]  Follow up in 2 months or sooner if needed.    Skin care recommendations  Bath time: Always use lukewarm water. AVOID very hot or cold water. Keep bathing time to 5-10 minutes. Do NOT use bubble bath. Use a mild soap and use just enough to wash the dirty areas. Do NOT scrub skin vigorously.  After bathing, pat dry your skin with a towel. Do NOT rub or scrub the skin.  Moisturizers and prescriptions:  ALWAYS apply moisturizers immediately after bathing (within 3 minutes). This helps to lock-in moisture. Use the moisturizer several times a day over the whole body. Good summer moisturizers include: Aveeno, CeraVe, Cetaphil. Good winter moisturizers include: Aquaphor, Vaseline, Cerave, Cetaphil, Eucerin, Vanicream. When using  moisturizers along with medications, the moisturizer should be applied about one hour after applying the medication to prevent diluting effect of the medication or moisturize around where you applied the medications. When not using medications, the moisturizer can be continued twice daily as maintenance.  Laundry and clothing: Avoid laundry products with added color or perfumes. Use unscented hypo-allergenic laundry products such as Tide free, Cheer free & gentle, and All free and clear.  If the skin still seems dry or sensitive, you can try double-rinsing the clothes. Avoid tight or scratchy clothing such as wool. Do not use fabric softeners or dyer sheets.

## 2021-04-19 NOTE — Assessment & Plan Note (Signed)
Started to break out in itchy rash 3 years ago. This can occur anywhere on her body lasting 10-20 minutes at a time. One episode of shortness of breath. Hydroxyzine helps but causes drowsiness. Tried low dose zyrtec and famotidine with no benefit. Denies changes in diet, meds, personal care products. Concerned about allergic triggers.   Based on clinical history, she likely has chronic idiopathic urticaria. Discussed with patient, that urticaria is usually caused by release of histamine by cutaneous mast cells but sometimes it is non-histamine mediated. Explained that urticaria is not always associated with allergies. In most cases, the exact etiology for urticaria can not be established and it is considered idiopathic.  No indication for any allergy testing today.   Start Allegra (fexofenadine) 180mg  twice a day.  If symptoms are not controlled or causes drowsiness let us know.  Start Pepcid (famotidine) 20mg  twice a day.   May take hydroxyzine 10mg  (1-2 tablets) every 8 hours as needed for breakthrough hives.  Avoid the following potential triggers: alcohol, tight clothing, NSAIDs, hot showers and getting overheated.  Get bloodwork if hives not controlled.   Read about Xolair. o [email or web address]

## 2021-07-11 NOTE — Progress Notes (Deleted)
Follow Up Note  RE: Anna Potts MRN: [id number] DOB: 1996-01-13 Date of Office Visit: 07/12/2021  Referring provider: Willow Ora, MD Primary care provider: Willow Ora, MD  Chief Complaint: No chief complaint on file.  History of Present Illness: I had the pleasure of seeing Solina Heron for a follow up visit at the [facility] on 07/11/2021. She is a 26 y.o. female, who is being followed for chronic idiopathic urticaria. Her previous allergy office visit was on 04/19/2021 with Dr. Selena Batten. Today is a regular follow up visit.  Did she get the labs drawn?  Chronic idiopathic urticaria Started to break out in itchy rash 3 years ago. This can occur anywhere on her body lasting 10-20 minutes at a time. One episode of shortness of breath. Hydroxyzine helps but causes drowsiness. Tried low dose zyrtec and famotidine with no benefit. Denies changes in diet, meds, personal care products. Concerned about allergic triggers.  Based on clinical history, she likely has chronic idiopathic urticaria. Discussed with patient, that urticaria is usually caused by release of histamine by cutaneous mast cells but sometimes it is non-histamine mediated. Explained that urticaria is not always associated with allergies. In most cases, the exact etiology for urticaria can not be established and it is considered idiopathic. No indication for any allergy testing today.  Start Allegra (fexofenadine) 180mg  twice a day. If symptoms are not controlled or causes drowsiness let know. Start Pepcid (famotidine) 20mg  twice a day.  May take hydroxyzine 10mg  (1-2 tablets) every 8 hours as needed for breakthrough hives. Avoid the following potential triggers: alcohol, tight clothing, NSAIDs, hot showers and getting overheated. Get bloodwork if hives not controlled.  Read about Xolair. [place]   Return in about 2 months (around 06/17/2021).  Assessment and Plan: [name] is a  26 y.o. female with: No problem-specific Assessment & Plan notes found for this encounter.  No follow-ups on file.  No orders of the defined types were placed in this encounter.  Lab Orders  No laboratory test(s) ordered today    Diagnostics: Spirometry:  Tracings reviewed. Her effort: {Blank single:19197::"Good reproducible efforts.","It was hard to get consistent efforts and there is a question as to whether this reflects a maximal maneuver.","Poor effort, data can not be interpreted."} FVC: ***L FEV1: ***L, ***% predicted FEV1/FVC ratio: ***% Interpretation: {Blank single:19197::"Spirometry consistent with mild obstructive disease","Spirometry consistent with moderate obstructive disease","Spirometry consistent with severe obstructive disease","Spirometry consistent with possible restrictive disease","Spirometry consistent with mixed obstructive and restrictive disease","Spirometry uninterpretable due to technique","Spirometry consistent with normal pattern","No overt abnormalities noted given today's efforts"}.  Please see scanned spirometry results for details.  Skin Testing: {Blank single:19197::"Select foods","Environmental allergy panel","Environmental allergy panel and select foods","Food allergy panel","None","Deferred due to recent antihistamines use"}. *** Results discussed with patient/family.   Medication List:  Current Outpatient Medications  Medication Sig Dispense Refill   ferrous sulfate (FERROUSUL) 325 (65 FE) MG tablet Take 1 tablet (325 mg total) by mouth 2 (two) times daily. 60 tablet 1   hydrOXYzine (ATARAX) 10 MG tablet Take 1-2 tablets as needed every 8 hours for breakthrough hives. 90 tablet 1   No current facility-administered medications for this visit.   Allergies: Allergies  Allergen Reactions   Codeine Itching   Grassleaf Sweetflag Rhizome Other (See Comments)    sneezing   Nickel Other (See Comments)    Eats away skin    I reviewed her past  medical history, social history, family history, and environmental history and  no significant changes have been reported from her previous visit.  Review of Systems  Constitutional:  Negative for appetite change, chills, fever and unexpected weight change.  HENT:  Negative for congestion and rhinorrhea.   Eyes:  Negative for itching.  Respiratory:  Negative for cough, chest tightness, shortness of breath and wheezing.   Cardiovascular:  Negative for chest pain.  Gastrointestinal:  Negative for abdominal pain.  Genitourinary:  Negative for difficulty urinating.  Skin:  Positive for rash.  Neurological:  Negative for headaches.   Objective: There were no vitals taken for this visit. There is no height or weight on file to calculate BMI. Physical Exam Vitals and nursing note reviewed.  Constitutional:      Appearance: Normal appearance. She is well-developed.  HENT:     Head: Normocephalic and atraumatic.     Right Ear: Tympanic membrane and external ear normal.     Left Ear: Tympanic membrane and external ear normal.     Nose: Nose normal.     Mouth/Throat:     Mouth: Mucous membranes are moist.     Pharynx: Oropharynx is clear.  Eyes:     Conjunctiva/sclera: Conjunctivae normal.  Cardiovascular:     Rate and Rhythm: Normal rate and regular rhythm.     Heart sounds: Normal heart sounds. No murmur heard.   No friction rub. No gallop.  Pulmonary:     Effort: Pulmonary effort is normal.     Breath sounds: Normal breath sounds. No wheezing, rhonchi or rales.  Musculoskeletal:     Cervical back: Neck supple.  Skin:    General: Skin is warm.     Findings: No rash.     Comments: No dermatographism on exam.  Neurological:     Mental Status: She is alert and oriented to person, place, and time.  Psychiatric:        Behavior: Behavior normal.   Previous notes and tests were reviewed. The plan was reviewed with the patient/family, and all questions/concerned were addressed.  It  was my pleasure to see [name] today and participate in her care. Please feel free to contact me with any questions or concerns.  Sincerely,  Wyline Mood, DO Allergy & Immunology  [facility] office: [phone] [facility] office: [phone]

## 2021-07-12 ENCOUNTER — Ambulatory Visit: Payer: Self-pay | Admitting: Allergy

## 2021-07-12 DIAGNOSIS — L501 Idiopathic urticaria: Secondary | ICD-10-CM

## 2021-07-30 ENCOUNTER — Ambulatory Visit
Admission: EM | Admit: 2021-07-30 | Discharge: 2021-07-30 | Disposition: A | Discharge status: Home / Self Care | Payer: Self-pay | Attending: Internal Medicine | Admitting: Internal Medicine

## 2021-07-30 DIAGNOSIS — N938 Other specified abnormal uterine and vaginal bleeding: Secondary | ICD-10-CM

## 2021-07-30 DIAGNOSIS — N926 Irregular menstruation, unspecified: Secondary | ICD-10-CM

## 2021-07-30 MED ORDER — MEDROXYPROGESTERONE ACETATE 10 MG PO TABS: 10.0000 mg | ORAL_TABLET | Freq: Every day | ORAL | 0 refills | Status: DC

## 2021-07-30 NOTE — ED Provider Notes (Signed)
[facility]    CSN: [id number] Arrival date & time: 07/30/21  0904      History   Chief Complaint Chief Complaint  Patient presents with   Vaginal Bleeding   STD Testing    HPI Anna Potts is a 26 y.o. female comes to urgent care with vaginal bleeding over the past several days.  Patient has a history of irregular menstrual cycle.  Last menstrual period was in April 26 and lasted for 4 days.  Patient states she goes through 3 or 4 pads in a day.  The pads are usually fully saturated.  Heavy bleeding is heavy.  She has had some dizziness but denies any syncope or near syncopal episodes.  No weight changes.  She does not see a gynecologist for her routine testing.  She denies any palpitations.  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 Authorizing Provider  medroxyPROGESTERone (PROVERA) 10 MG tablet Take 1 tablet (10 mg total) by mouth daily for 10 days. 07/30/21 08/09/21 Yes Zola Runion, Britta Mccreedy, MD  ferrous sulfate (FERROUSUL) 325 (65 FE) MG tablet Take 1 tablet (325 mg total) by mouth 2 (two) times daily. 10/25/12   Leftwich-Kirby, Wilmer Floor, CNM  hydrOXYzine (ATARAX) 10 MG tablet Take 1-2  tablets as needed every 8 hours for breakthrough hives. 04/19/21   Ellamae Sia, DO    Family History Family History  Family history unknown: Yes    Social History Social History   Tobacco Use   Smoking status: Never   Smokeless tobacco: Never  Substance Use Topics   Alcohol use: No   Drug use: Yes    Types: Marijuana     Allergies   Codeine, Grassleaf sweetflag rhizome, and Nickel   Review of Systems Review of Systems  HENT: Negative.    Respiratory: Negative.    Gastrointestinal:  Negative for abdominal pain.  Genitourinary:  Positive for vaginal bleeding. Negative for dysuria, frequency, urgency and vaginal discharge.  Neurological: Negative.     Physical Exam Triage Vital Signs ED Triage Vitals  Enc Vitals Group     BP 07/30/21 0910 116/65     Pulse Rate 07/30/21 0910 60     Resp 07/30/21 0910 18     Temp 07/30/21 0910 (!) 97.5 F (36.4 C)     Temp Source 07/30/21 0910 Oral  SpO2 07/30/21 0910 98 %     Weight --      Height --      Head Circumference --      Peak Flow --      Pain Score 07/30/21 0911 0     Pain Loc --      Pain Edu? --      Excl. in GC? --    No data found.  Updated Vital Signs BP 116/65 (BP Location: Left Arm)   Pulse 60   Temp (!) 97.5 F (36.4 C) (Oral)   Resp 18   LMP 06/20/2021   SpO2 98%   Visual Acuity Right Eye Distance:   Left Eye Distance:   Bilateral Distance:    Right Eye Near:   Left Eye Near:    Bilateral Near:     Physical Exam Vitals and nursing note reviewed.  Constitutional:      General: She is not in acute distress.    Appearance: She is not ill-appearing.  Cardiovascular:     Rate and Rhythm: Normal rate and regular rhythm.     Pulses: Normal pulses.     Heart sounds: Normal heart sounds.  Pulmonary:     Effort: Pulmonary effort is normal.     Breath sounds: Normal breath sounds.  Abdominal:     General: Bowel sounds are normal.  Musculoskeletal:        General: Normal range of motion.   Neurological:     Mental Status: She is alert.     UC Treatments / Results  Labs (all labs ordered are listed, but only abnormal results are displayed) Labs Reviewed  CBC  TSH    EKG   Radiology No results found.  Procedures Procedures (including critical care time)  Medications Ordered in UC Medications - No data to display  Initial Impression / Assessment and Plan / UC Course  I have reviewed the triage vital signs and the nursing notes.  Pertinent labs & imaging results that were available during my care of the patient were reviewed by me and considered in my medical decision making (see chart for details).     1.  Dysfunctional uterine bleeding: Provera 10 mg orally daily for 10 days CBC, TSH Patient may have PCOS She is recommended to follow-up with gynecologist for further evaluation and management. Return precautions given. Final Clinical Impressions(s) / UC Diagnoses   Final diagnoses:  Irregular periods/menstrual cycles  Dysfunctional uterine bleeding     Discharge Instructions      Please take medications as prescribed You need to follow-up with your gynecologist for routine gynecological care and also for further evaluation of irregular menstrual period's If you have abdominal pain, persistent dizziness, fainting or near fainting episodes please go to the emergency department to be evaluated We will call you with recommendations if labs are abnormal.   ED Prescriptions     Medication Sig Dispense Auth. Provider   medroxyPROGESTERone (PROVERA) 10 MG tablet Take 1 tablet (10 mg total) by mouth daily for 10 days. 10 tablet Batool Majid, Britta Mccreedy, MD      PDMP not reviewed this encounter.   Merrilee Jansky, MD 07/30/21 1000

## 2021-07-30 NOTE — ED Triage Notes (Signed)
Pt presents with chronic abnormal vaginal bleeding; pt states her period has been on since April 26th.  Pt also requesting STD testing.

## 2021-07-30 NOTE — Discharge Instructions (Addendum)
Please take medications as prescribed You need to follow-up with your gynecologist for routine gynecological care and also for further evaluation of irregular menstrual period's If you have abdominal pain, persistent dizziness, fainting or near fainting episodes please go to the emergency department to be evaluated We will call you with recommendations if labs are abnormal.

## 2021-07-31 LAB — CBC
Hematocrit: 35.9 % (ref 34.0–46.6)
Hemoglobin: 11.5 g/dL (ref 11.1–15.9)
MCH: 28.8 pg (ref 26.6–33.0)
MCHC: 32 g/dL (ref 31.5–35.7)
MCV: 90 fL (ref 79–97)
Platelets: 280 10*3/uL (ref 150–450)
RBC: 3.99 x10E6/uL (ref 3.77–5.28)
RDW: 10.8 % — ABNORMAL LOW (ref 11.7–15.4)
WBC: 4 10*3/uL (ref 3.4–10.8)

## 2021-07-31 LAB — TSH: TSH: 1.65 u[IU]/mL (ref 0.450–4.500)

## 2021-08-12 ENCOUNTER — Emergency Department (HOSPITAL_COMMUNITY)
Admission: EM | Admit: 2021-08-12 | Discharge: 2021-08-12 | Discharge status: Against Medical Advice | Payer: Self-pay | Attending: Emergency Medicine | Admitting: Emergency Medicine

## 2021-08-12 ENCOUNTER — Other Ambulatory Visit: Payer: Self-pay

## 2021-08-12 ENCOUNTER — Encounter (HOSPITAL_COMMUNITY): Payer: Self-pay | Admitting: Emergency Medicine

## 2021-08-12 ENCOUNTER — Ambulatory Visit
Admission: EM | Admit: 2021-08-12 | Discharge: 2021-08-12 | Disposition: A | Discharge status: Home / Self Care | Payer: Self-pay

## 2021-08-12 DIAGNOSIS — N939 Abnormal uterine and vaginal bleeding, unspecified: Secondary | ICD-10-CM | POA: Insufficient documentation

## 2021-08-12 DIAGNOSIS — Z5321 Procedure and treatment not carried out due to patient leaving prior to being seen by health care provider: Secondary | ICD-10-CM | POA: Insufficient documentation

## 2021-08-12 NOTE — ED Triage Notes (Signed)
Pt c/o excessive vaginal bleeding to the extent it is interfering with work. States financial barriers prevents seeing specialist. States hormonal pill given last tx made sxs worse. States changing adult diapers q45mins at times.

## 2021-08-12 NOTE — ED Triage Notes (Signed)
Reports heavy vaginal bleeding since June 5th. Took Provera for 10 days, today is day 10 but her bleeding continues to be heavy. Is changing her adult diapers every 30 min. Periods are not normally this heavy. Irregular periods. Takes an iron supplement with her periods. Would like testing for BV and yeast, denies being sexually active.

## 2021-08-12 NOTE — ED Provider Notes (Signed)
[facility]    CSN: [id number] Arrival date & time: 08/12/21  1122      History   Chief Complaint Chief Complaint  Patient presents with   Vaginal Bleeding    HPI Anna Potts is a 26 y.o. female.   Patient presents with abnormal vaginal bleeding that has been present since June 20, 2021.  Patient was seen on 07/30/2021 and was prescribed Megace.  Patient reports that vaginal bleeding has worsened since being prescribed this medication.  She states that she is having excessive vaginal bleeding where it is "running down her legs".  She is no longer wearing pads or tampons and has been having to wear adult diapers.  She reports that she is having to change the adult diapers every 30 minutes to 2 hours.  Also reports large blood clots as well.  She reports some lower abdominal cramping as well that started a few days prior.  She also reports some dizziness and blurred vision that started a few days prior as well.  She typically has monthly menstrual cycles but reports that similar situation occurred when she was about 26 years old.  She has had to take birth control in the past to help regulate periods and bleeding but has not had insurance recently so she has not been able to do this.  She is not sure if symptoms are related to STD but is open to testing.   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 Authorizing Provider  ferrous sulfate (FERROUSUL) 325 (65 FE) MG tablet Take 1 tablet (325 mg total) by mouth 2 (two) times daily. 10/25/12   Leftwich-Kirby, Wilmer Floor, CNM  hydrOXYzine (ATARAX) 10 MG tablet Take 1-2 tablets as needed every 8 hours for breakthrough hives. 04/19/21   Ellamae Sia, DO  medroxyPROGESTERone (PROVERA) 10 MG tablet Take 1 tablet (10 mg total) by mouth daily for 10 days. 07/30/21 08/09/21  Lamptey, Britta Mccreedy, MD    Family History Family History  Family history unknown: Yes    Social History Social History   Tobacco Use   Smoking status: Never   Smokeless tobacco: Never  Substance Use Topics   Alcohol use: No   Drug use: Yes    Types: Marijuana     Allergies   Codeine, Grassleaf sweetflag rhizome, and Nickel   Review of Systems Review of Systems Per HPI  Physical Exam Triage Vital Signs ED Triage Vitals [08/12/21 1133]  Enc Vitals Group     BP 120/82     Pulse Rate 100     Resp 18     Temp 98 F (36.7 C)     Temp Source Oral     SpO2 95 %     Weight      Height      Head Circumference      Peak Flow      Pain Score 0     Pain Loc      Pain Edu?      Excl. in GC?    No data found.  Updated Vital Signs BP 120/82 (BP Location: Left Arm)   Pulse 100   Temp 98 F (36.7 C) (Oral)   Resp 18   LMP 06/20/2021   SpO2 95%   Visual Acuity Right Eye Distance:   Left Eye Distance:   Bilateral Distance:    Right Eye Near:   Left Eye Near:    Bilateral Near:     Physical Exam   UC Treatments / Results  Labs (all labs ordered are listed, but only abnormal results are displayed) Labs Reviewed - No data to display  EKG   Radiology No results found.  Procedures Procedures (including critical care time)  Medications Ordered in UC Medications  - No data to display  Initial Impression / Assessment and Plan / UC Course  I have reviewed the triage vital signs and the nursing notes.  Pertinent labs & imaging results that were available during my care of the patient were reviewed by me and considered in my medical decision making (see chart for details).     Due to patient's description of excessive vaginal bleeding and associated dizziness and blurred vision, I do think this warrants stat blood work and further evaluation and management.  I do not have the resources or ability here to order stat blood work in urgent care.  Therefore, patient was advised to go to the ER for further evaluation and management.  Patient was agreeable with plan.  Vital signs stable at discharge.  Agree with patient self transport to the hospital. Final Clinical Impressions(s) / UC Diagnoses   Final diagnoses:  Abnormal vaginal bleeding     Discharge Instructions      Please go to the emergency department as soon as you leave urgent care for further evaluation and management of excessive vaginal bleeding.    ED Prescriptions   None    PDMP not reviewed this encounter.   Gustavus Bryant, [place] 08/12/21 1159

## 2021-08-12 NOTE — ED Notes (Signed)
Patient called x2 for room placement without answer.

## 2021-08-12 NOTE — Discharge Instructions (Signed)
Please go to the emergency department as soon as you leave urgent care for further evaluation and management of excessive vaginal bleeding.

## 2021-08-13 ENCOUNTER — Telehealth: Payer: Self-pay | Admitting: Family Medicine

## 2021-08-13 NOTE — Telephone Encounter (Signed)
Patient declined triage on 08/12/21 - please advise .     Government social research officer at [facility] [employer] - Human resources officer Healthcare at [facility] Provider Asencion Partridge- MD Contact Type Call Who Is Calling Patient / Member / Family / Caregiver Caller Name Bellany Elbaum Phone Number [phone] Patient Name Anna Potts Patient DOB 10/20/1995 Call Type Message Only Information Provided Reason for Call Request to Schedule Office Appointment Initial Comment Caller states, needs an emergency appt for tomorrow. Has a lot of vaginal bleeding. Very heavy. On progesterone. Not helping much. Declined triage. Patient request to speak to RN No Additional Comment Caller declined triage.

## 2021-08-15 ENCOUNTER — Ambulatory Visit: Payer: Medicaid Other | Admitting: Family Medicine

## 2021-08-17 ENCOUNTER — Encounter: Payer: Self-pay | Admitting: Family Medicine

## 2021-08-17 ENCOUNTER — Ambulatory Visit (INDEPENDENT_AMBULATORY_CARE_PROVIDER_SITE_OTHER): Payer: Medicaid Other | Admitting: Family Medicine

## 2021-08-17 VITALS — BP 122/80 | HR 68 | Temp 97.7°F | Ht 66.0 in | Wt 250.4 lb

## 2021-08-17 DIAGNOSIS — N938 Other specified abnormal uterine and vaginal bleeding: Secondary | ICD-10-CM

## 2021-08-17 DIAGNOSIS — D500 Iron deficiency anemia secondary to blood loss (chronic): Secondary | ICD-10-CM

## 2021-08-17 NOTE — Progress Notes (Signed)
Subjective  CC:  Chief Complaint  Patient presents with   Menstrual Problem    HPI: Anna Potts is a 26 y.o. female who presents to the office today to address the problems listed above in the chief complaint. 26 year old last here about 3 years ago presents due to irregular bleeding.  I reviewed recent urgent care notes and lab work.  In brief summary, patient has long history of dysfunctional uterine bleeding starting at age 64.  She has been managed intermittently when she follows up for care with oral contraceptives.  They have worked well for her.  However she recently ran out over the last year or 2.  Cycles were regular until January of this year.  Since January she has had 2 cycles, the most recent being LMP was 26.  She experienced heavy extra bleeding.  Was treated with Provera for 10 days but was not been started on contraceptives.  She then experienced a heavy withdrawal bleed and sought further urgent care follow-up but was unable to be seen at the emergency room.  She had leftover birth control pills and started taking them.  Since then, her bleeding has lightened considerably.  She has no symptoms of hypovolemia.  CBC from June 5 showed a hemoglobin 11.5.  She is now on iron pills.  She has no pelvic pain.  In the past she has had mild hirsutism and acne, however she now denies hirsutism.  Her acne is well managed with routine skin care.  She had a normal LH in the past.  She has never had a pelvic ultrasound although it was ordered.  She is in a monogamous relationship.  No risk of pregnancy. Health maintenance: She is overdue for annual exam and Pap smear.  Assessment  1. Dysfunctional uterine bleeding   2. Iron deficiency anemia due to chronic blood loss      Plan  Dysfunctional uterine bleeding: Educated on counseling.  Continue birth control pills.  Patient will tell me what she is on I will order them for her.  Recommend staying on them for the next 6 to 12 months.  We will  reassess in 3 months time.  She may have PCOS but defers work-up at this time. Mild iron deficiency anemia presumed: Continue iron therapy.  Recheck 3 months.  Due to blood loss. Family planning Medicaid only.  Follow up: 3 months for complete physical with Pap smear Visit date not found  No orders of the defined types were placed in this encounter.  No orders of the defined types were placed in this encounter.     I reviewed the patients updated PMH, FH, and SocHx.    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   hydrOXYzine (ATARAX) 10 MG tablet Take 1-2 tablets as needed every 8 hours for breakthrough hives.    Allergies: Patient is allergic to codeine, grassleaf sweetflag rhizome, and nickel. Family History: Patient Family history is unknown by patient. Social History:  Patient  reports that she has never smoked. She has never used smokeless tobacco. She reports current drug use. Drug:  Marijuana. She reports that she does not drink alcohol.  Review of Systems: Constitutional: Negative for fever malaise or anorexia Cardiovascular: negative for chest pain Respiratory: negative for SOB or persistent cough Gastrointestinal: negative for abdominal pain  Objective  Vitals: BP 122/80   Pulse 68   Temp 97.7 F (36.5 C)   Ht 5\' 6"  (1.676 m)   Wt 250 lb 6.4 oz (113.6 kg)   LMP 06/20/2021   SpO2 99%   BMI 40.42 kg/m  General: no acute distress , A&Ox3 HEENT: PEERL, conjunctiva normal, neck is supple Cardiovascular:  RRR without murmur or gallop.  Respiratory:  Good breath  sounds bilaterally, CTAB with normal respiratory effort Skin:  Warm, no rashes Soft nontender abdomen  No visits with results within 1 Day(s) from this visit.  Latest known visit with results is:  Admission on 07/30/2021, Discharged on 07/30/2021  Component Date Value Ref Range Status   WBC 07/30/2021 4.0  3.4 - 10.8 x10E3/uL Final   RBC 07/30/2021 3.99  3.77 - 5.28 x10E6/uL Final   Hemoglobin 07/30/2021 11.5  11.1 - 15.9 g/dL Final   Hematocrit 82/95/6213 35.9  34.0 - 46.6 % Final   MCV 07/30/2021 90  79 - 97 fL Final   MCH 07/30/2021 28.8  26.6 - 33.0 pg Final   MCHC 07/30/2021 32.0  31.5 - 35.7 g/dL Final   RDW 08/65/7846 10.8 (L)  11.7 - 15.4 % Final   Platelets 07/30/2021 280  150 - 450 x10E3/uL Final   TSH 07/30/2021 1.650  0.450 - 4.500 uIU/mL Final       Commons side effects, risks, benefits, and alternatives for medications and treatment plan prescribed today were discussed, and the patient expressed understanding of the given instructions. Patient is instructed to call or message via MyChart if he/she has any questions or concerns regarding our treatment plan. No barriers to understanding were identified. We discussed Red Flag symptoms and signs in detail. Patient expressed understanding regarding what to do in case of urgent or emergency type symptoms.  Medication list was reconciled, printed and provided to the patient in AVS. Patient instructions and summary information was reviewed with the patient as documented in the AVS. This note was prepared with assistance of Dragon voice recognition software. Occasional wrong-word or sound-a-like substitutions may have occurred due to the inherent limitations of voice recognition software  This visit occurred during the SARS-CoV-2 public health emergency.  Safety protocols were in place, including screening questions prior to the visit, additional usage of staff PPE, and extensive cleaning of exam room while observing appropriate  contact time as indicated for disinfecting solutions.

## 2021-08-24 ENCOUNTER — Telehealth: Payer: Self-pay | Admitting: Family Medicine

## 2021-08-24 NOTE — Telephone Encounter (Signed)
Pt states she was unable to upload a pic of her birth control. She states it is called Allergan?

## 2021-08-29 NOTE — Telephone Encounter (Signed)
From your last note, you wanted pt to let you know the name of her BC.

## 2021-08-30 NOTE — Telephone Encounter (Signed)
Patient wants a phone call from provider or cma or a mychart message back - stated shes been off birth control too long.

## 2021-08-30 NOTE — Telephone Encounter (Signed)
Please call patient or her pharmacy: need to know which birth control pill she is on.  ALLERGAN is not a known birth control pill - not coming up in our system.

## 2021-08-31 MED ORDER — ORTHO-NOVUM 1/35 (28) 1-35 MG-MCG PO TABS: 1.0000 | ORAL_TABLET | Freq: Every day | ORAL | 11 refills | Status: DC

## 2021-11-19 ENCOUNTER — Encounter: Payer: Self-pay | Admitting: *Deleted

## 2021-11-19 ENCOUNTER — Encounter: Payer: Medicaid Other | Admitting: Family Medicine

## 2021-12-06 ENCOUNTER — Ambulatory Visit
Admission: EM | Admit: 2021-12-06 | Discharge: 2021-12-06 | Disposition: A | Discharge status: Home / Self Care | Payer: Self-pay | Attending: Family Medicine | Admitting: Family Medicine

## 2021-12-06 ENCOUNTER — Encounter: Payer: Self-pay | Admitting: Emergency Medicine

## 2021-12-06 ENCOUNTER — Other Ambulatory Visit: Payer: Self-pay

## 2021-12-06 DIAGNOSIS — R102 Pelvic and perineal pain: Secondary | ICD-10-CM | POA: Insufficient documentation

## 2021-12-06 LAB — POCT URINALYSIS DIP (MANUAL ENTRY)
Bilirubin, UA: NEGATIVE
Glucose, UA: NEGATIVE mg/dL
Ketones, POC UA: NEGATIVE mg/dL
Nitrite, UA: NEGATIVE
Protein Ur, POC: NEGATIVE mg/dL
Spec Grav, UA: 1.02 (ref 1.010–1.025)
Urobilinogen, UA: 0.2 E.U./dL
pH, UA: 7 (ref 5.0–8.0)

## 2021-12-06 LAB — POCT URINE PREGNANCY: Preg Test, Ur: NEGATIVE

## 2021-12-06 MED ORDER — ONDANSETRON 4 MG PO TBDP: 4.0000 mg | ORAL_TABLET | Freq: Three times a day (TID) | ORAL | 0 refills | Status: DC | PRN

## 2021-12-06 MED ORDER — DOXYCYCLINE HYCLATE 100 MG PO CAPS: 100.0000 mg | ORAL_CAPSULE | Freq: Two times a day (BID) | ORAL | 0 refills | Status: DC

## 2021-12-06 MED ORDER — CEFTRIAXONE SODIUM 500 MG IJ SOLR
500.0000 mg | Freq: Once | INTRAMUSCULAR | Status: AC
  Administered 2021-12-06: 500 mg via INTRAMUSCULAR

## 2021-12-06 NOTE — ED Triage Notes (Signed)
Pt sts lower abd pain into rectal area; pt denies dysuria x 2 days

## 2021-12-06 NOTE — ED Provider Notes (Signed)
[facility]   [id number] 12/06/21 Arrival Time: 1900  ASSESSMENT & PLAN:  1. Pelvic pain    Will treat empirically for PID. Discussed. Meds ordered this encounter  Medications   cefTRIAXone (ROCEPHIN) injection 500 mg   doxycycline (VIBRAMYCIN) 100 MG capsule    Sig: Take 1 capsule (100 mg total) by mouth 2 (two) times daily.    Dispense:  14 capsule    Refill:  0   Urine culture pending.    Discharge Instructions      You have been given the following today for treatment of suspected gonorrhea and/or chlamydia:  cefTRIAXone (ROCEPHIN) injection 500 mg  Please pick up your prescription for doxycycline 100 mg and begin taking twice daily for the next seven (7) days.  Even though we have treated you today, we have sent testing for sexually transmitted infections. We will notify you of any positive results once they are received. If required, we will prescribe any medications you might need.  Please refrain from all sexual activity for at least the next seven days.     Labs Reviewed  POCT URINALYSIS DIP (MANUAL ENTRY) - Abnormal; Notable for the following components:      Result Value   Blood, UA moderate (*)    Leukocytes, UA Trace (*)    All other components within normal limits  URINE CULTURE  POCT URINE PREGNANCY  CERVICOVAGINAL ANCILLARY ONLY  Above pending.  Reviewed expectations re: course of current medical issues. Questions answered. Outlined signs and symptoms indicating need for more acute intervention. Patient verbalized understanding. After Visit Summary given.   SUBJECTIVE:  Anna Potts is a 26 y.o. female who presents with complaint of lower abdominal/pelvic pain. "Cramping feeling". Questions slight vaginal discharge. Symptoms x 2 days. No specific aggravating or alleviating factors reported. Denies: urinary frequency, dysuria, and gross hematuria. Afebrile. No abdominal or pelvic pain. Normal PO intake wihout n/v. No genital rashes  or lesions. Reports that she is sexually active with both female and female partners. No LMP recorded. (Menstrual status: Irregular Periods).   OBJECTIVE:  Vitals:   12/06/21 1920  BP: 132/73  Pulse: 96  Resp: 18  Temp: 98.1 F (36.7 C)  TempSrc: Oral  SpO2: 97%     General appearance: alert, cooperative, appears stated age and no distress Lungs: unlabored respirations; speaks full sentences without difficulty Back: no CVA tenderness; FROM at waist Abdomen: soft, non-tender GU: declined Skin: warm and dry Psychological: alert and cooperative; normal mood and affect.  Results for orders placed or performed during the hospital encounter of 12/06/21  POCT urine pregnancy  Result Value Ref Range   Preg Test, Ur Negative Negative  POCT urinalysis dipstick  Result Value Ref Range   Color, UA yellow yellow   Clarity, UA clear clear   Glucose, UA negative negative mg/dL   Bilirubin, UA negative negative   Ketones, POC UA negative negative mg/dL   Spec Grav, UA 2.671 2.458 - 1.025   Blood, UA moderate (A) negative   pH, UA 7.0 5.0 - 8.0   Protein Ur, POC negative negative mg/dL   Urobilinogen, UA 0.2 0.2 or 1.0 E.U./dL   Nitrite, UA Negative Negative   Leukocytes, UA Trace (A) Negative    Labs Reviewed  POCT URINALYSIS DIP (MANUAL ENTRY) - Abnormal; Notable for the following components:      Result Value   Blood, UA moderate (*)    Leukocytes, UA Trace (*)    All other components within normal  limits  URINE CULTURE  POCT URINE PREGNANCY  CERVICOVAGINAL ANCILLARY ONLY    Allergies  Allergen Reactions   Codeine Itching   Grassleaf Sweetflag Rhizome Other (See Comments)    sneezing   Nickel Other (See Comments)    Eats away skin     Past Medical History:  Diagnosis Date   Dermatographia    Iron deficiency anemia    Menstrual periods irregular    Family History  Family history unknown: Yes   Social History   Socioeconomic History   Marital status: Single     Spouse name: Not on file   Number of children: Not on file   Years of education: Not on file   Highest education level: Not on file  Occupational History   Not on file  Tobacco Use   Smoking status: Never   Smokeless tobacco: Never  Substance and Sexual Activity   Alcohol use: No   Drug use: Yes    Types: Marijuana   Sexual activity: Never    Birth control/protection: Pill  Other Topics Concern   Not on file  Social History Narrative   Not on file   Social Determinants of Health   Financial Resource Strain: Not on file  Food Insecurity: Not on file  Transportation Needs: Not on file  Physical Activity: Not on file  Stress: Not on file  Social Connections: Not on file  Intimate Partner Violence: Not on file           Vanessa Kick, MD 12/06/21 2004

## 2021-12-06 NOTE — Discharge Instructions (Addendum)

## 2021-12-08 LAB — URINE CULTURE: Culture: NO GROWTH

## 2021-12-10 LAB — CERVICOVAGINAL ANCILLARY ONLY
Bacterial Vaginitis (gardnerella): POSITIVE — AB
Candida Glabrata: NEGATIVE
Candida Vaginitis: POSITIVE — AB
Chlamydia: NEGATIVE
Comment: NEGATIVE
Comment: NEGATIVE
Comment: NEGATIVE
Comment: NEGATIVE
Comment: NEGATIVE
Comment: NORMAL
Neisseria Gonorrhea: NEGATIVE
Trichomonas: NEGATIVE

## 2021-12-18 ENCOUNTER — Encounter: Payer: Medicaid Other | Admitting: Family Medicine

## 2021-12-28 IMAGING — CR DG FOOT COMPLETE 3+V*L*
3 series · 3 of 3 positions shown · non-contrast
Comparison: None.

CLINICAL DATA: Left foot injury

EXAM:
LEFT FOOT - COMPLETE 3+ VIEW

[foot ap]
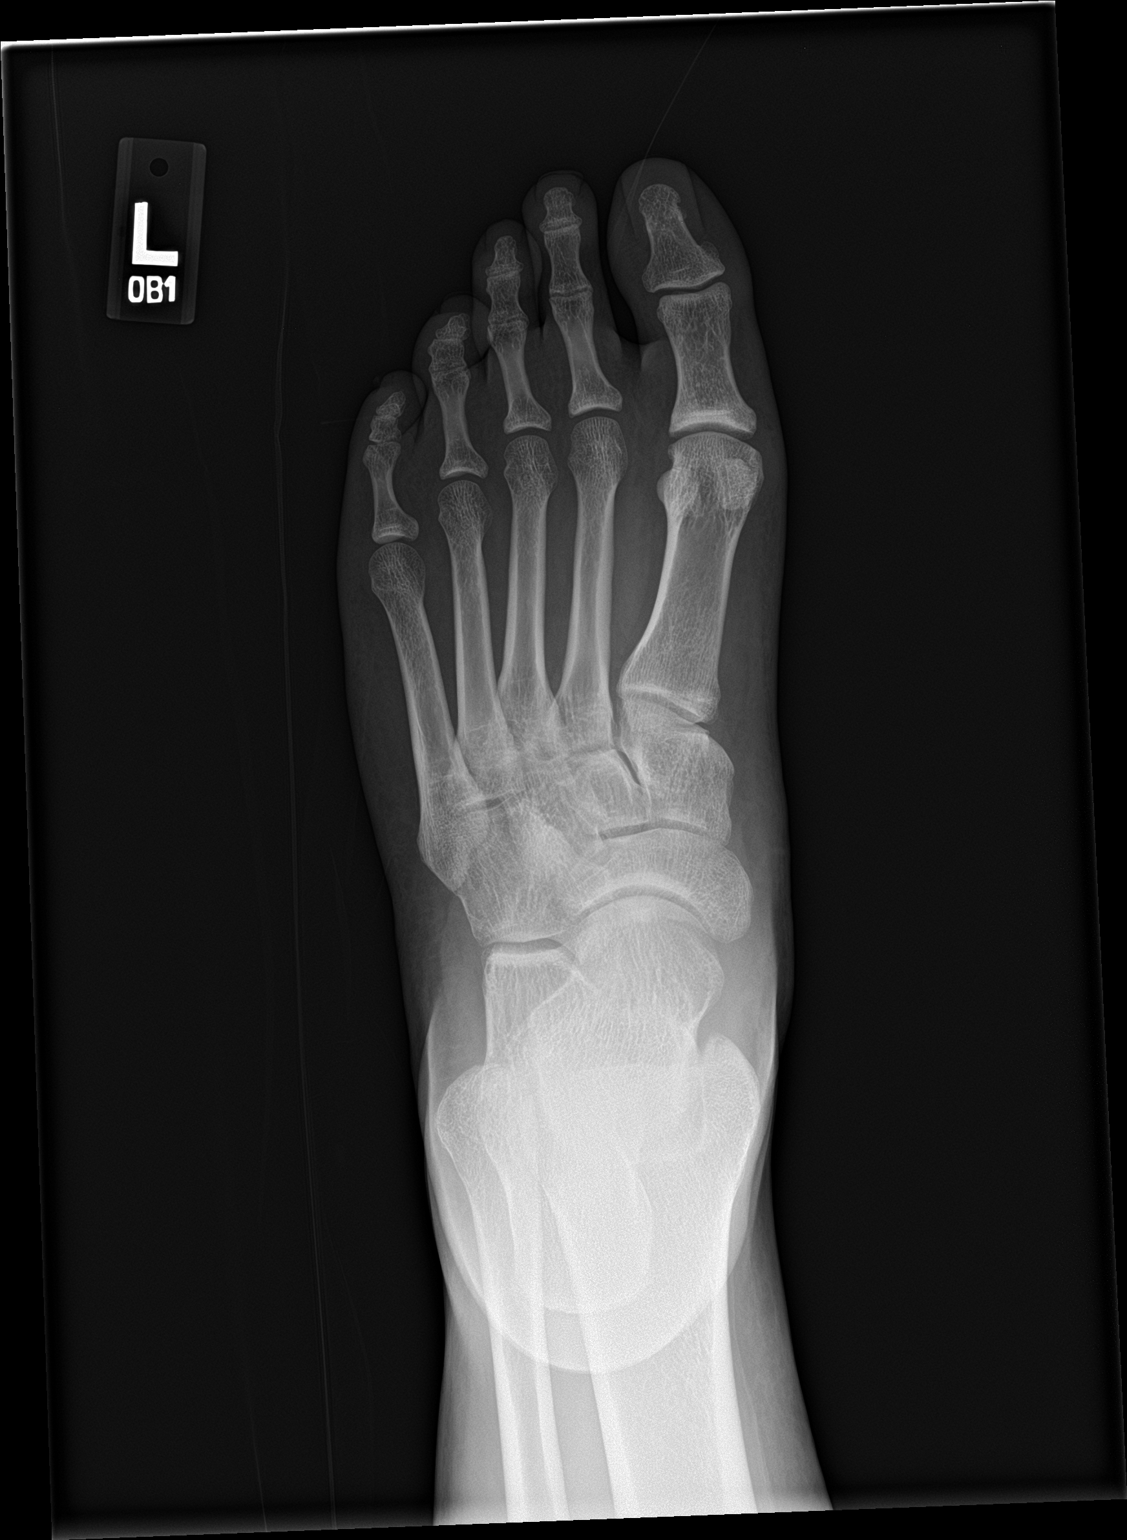

[foot obl]
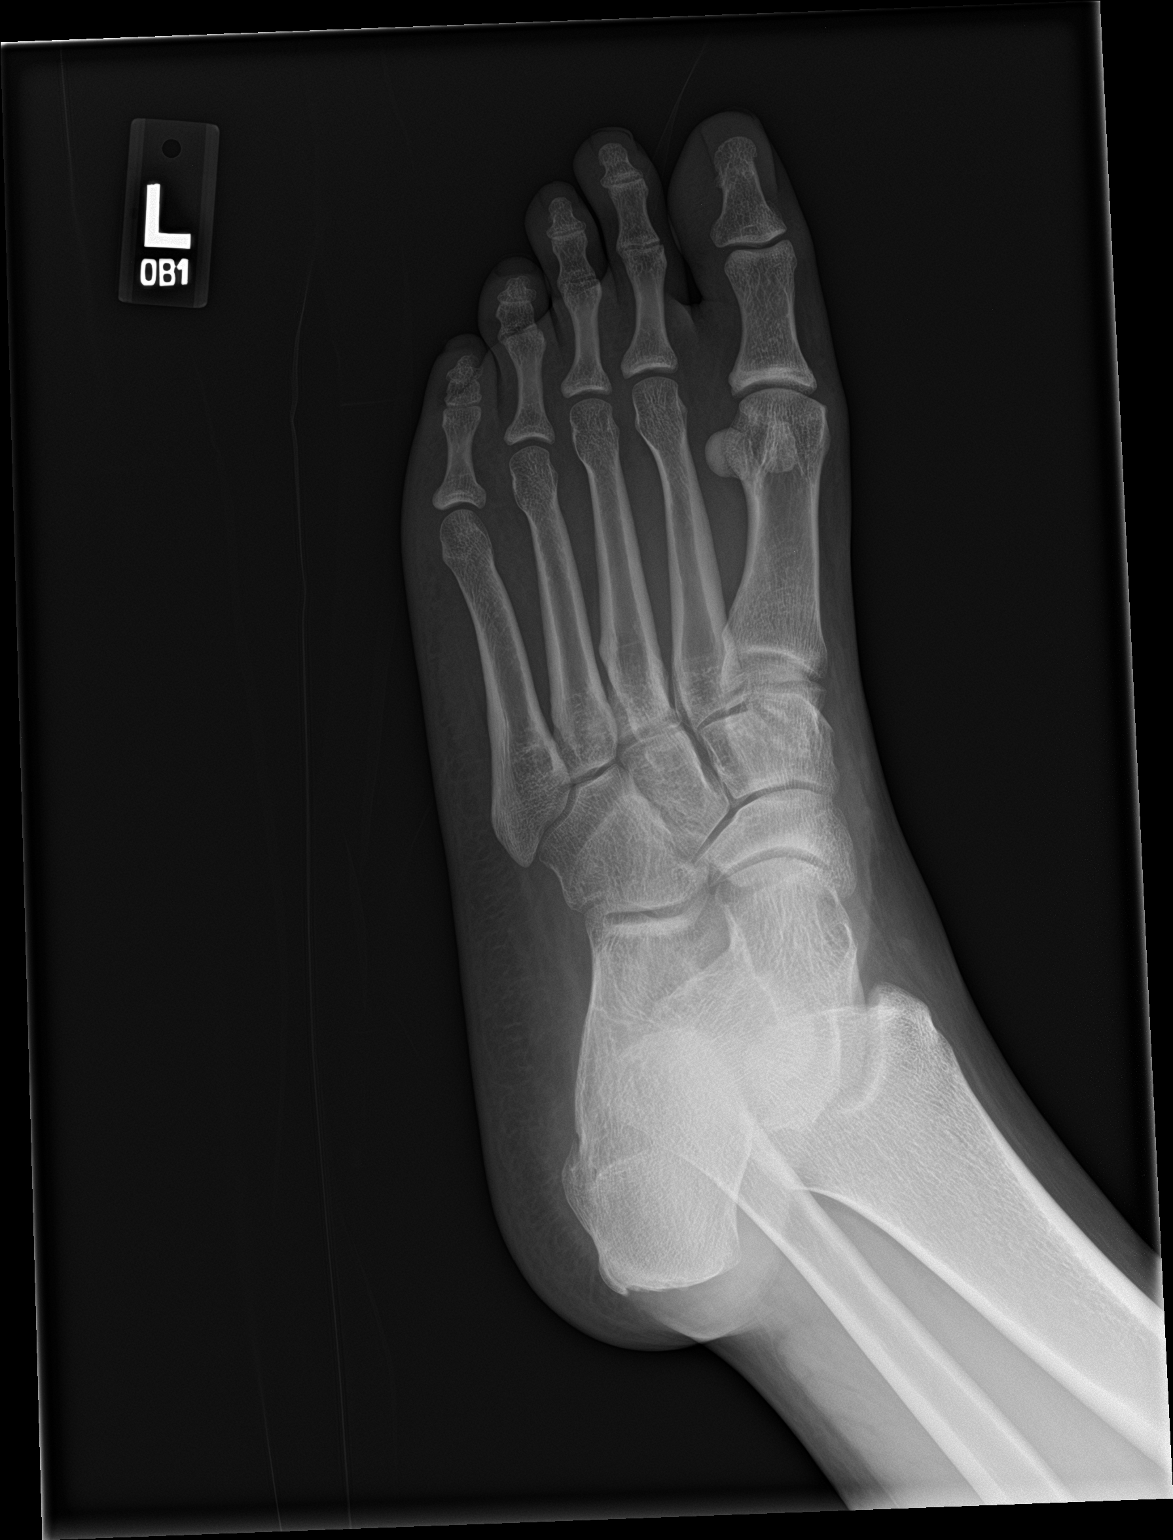

[foot lat]
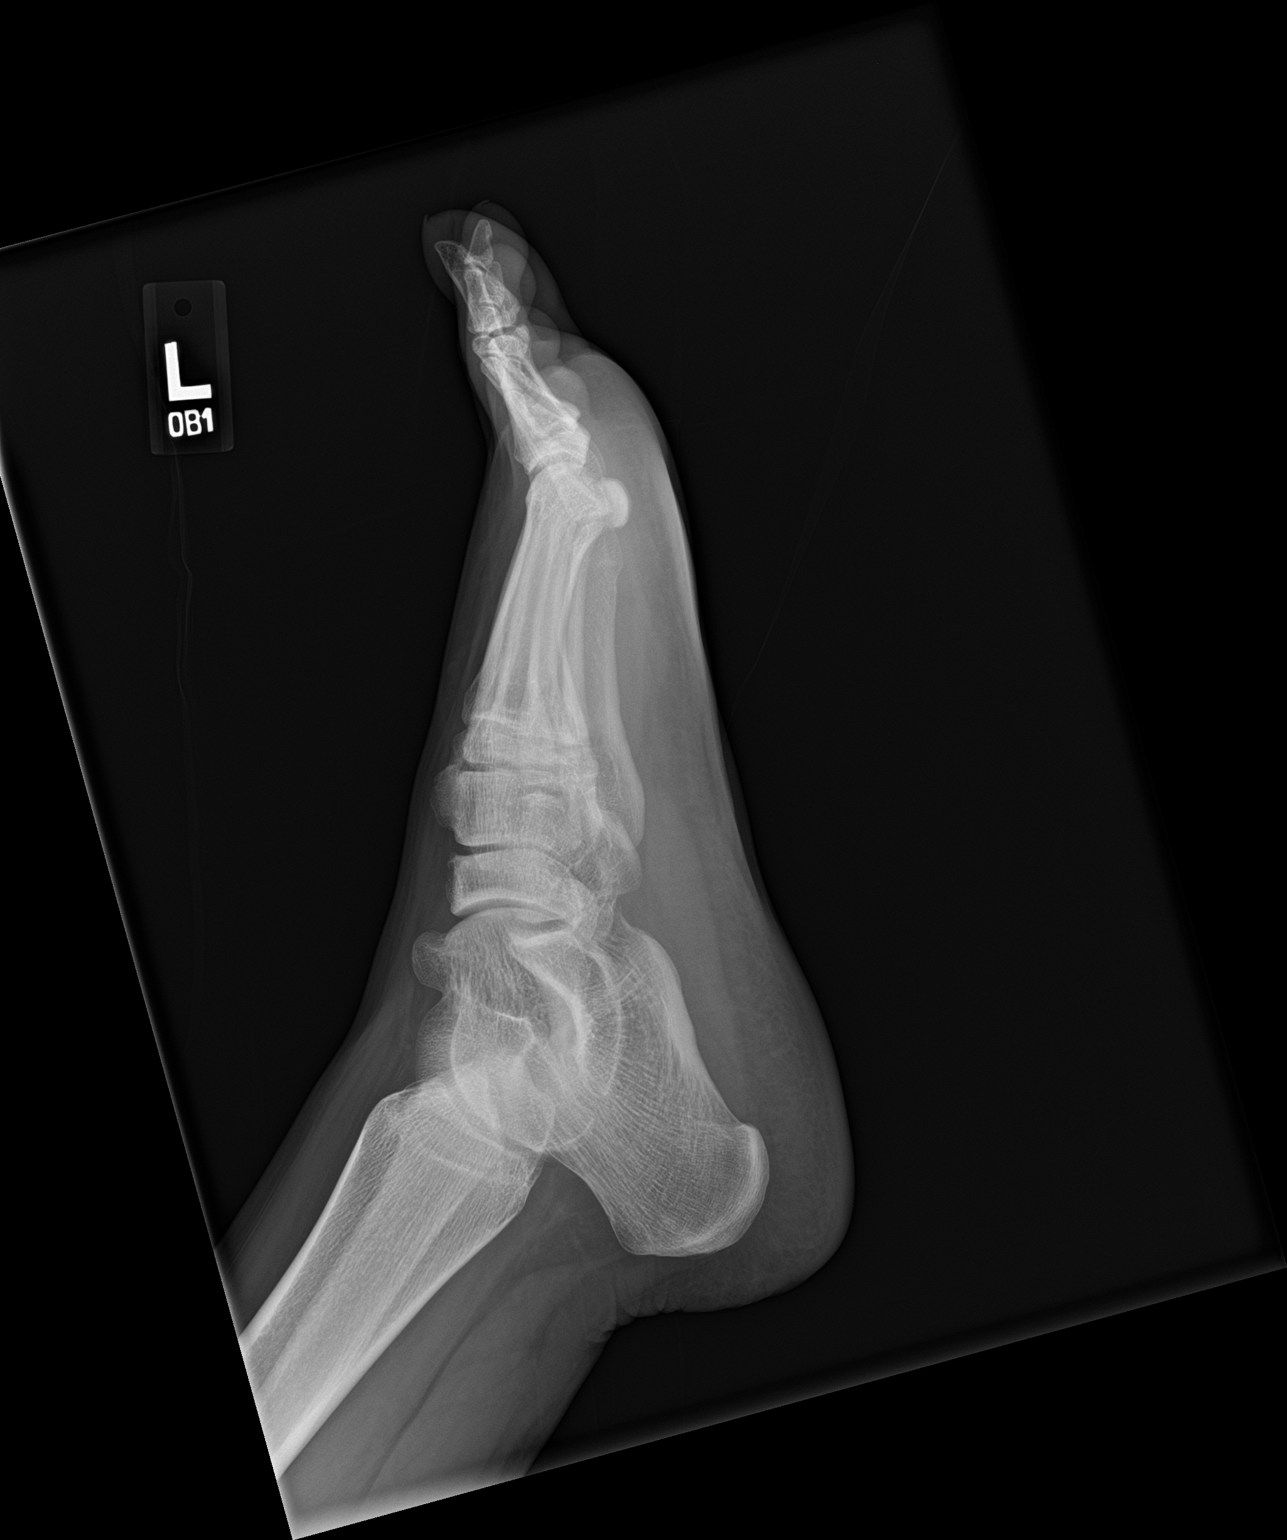

[3 of 3 positions shown; findings below may reference images not displayed]

FINDINGS: There is no evidence of fracture or dislocation. There is no
evidence of arthropathy or other focal bone abnormality. Soft
tissues are unremarkable.
IMPRESSION: Negative.

## 2022-01-25 DIAGNOSIS — Z419 Encounter for procedure for purposes other than remedying health state, unspecified: Secondary | ICD-10-CM | POA: Diagnosis not present

## 2022-02-05 ENCOUNTER — Encounter: Payer: Medicaid Other | Admitting: Family Medicine

## 2022-02-07 ENCOUNTER — Encounter: Payer: Self-pay | Admitting: *Deleted

## 2022-02-25 DIAGNOSIS — Z419 Encounter for procedure for purposes other than remedying health state, unspecified: Secondary | ICD-10-CM | POA: Diagnosis not present

## 2022-03-26 ENCOUNTER — Encounter: Payer: Medicaid Other | Admitting: Family Medicine

## 2022-03-28 DIAGNOSIS — Z419 Encounter for procedure for purposes other than remedying health state, unspecified: Secondary | ICD-10-CM | POA: Diagnosis not present

## 2022-04-20 ENCOUNTER — Encounter (HOSPITAL_COMMUNITY): Payer: Self-pay | Admitting: Obstetrics & Gynecology

## 2022-04-20 ENCOUNTER — Inpatient Hospital Stay (HOSPITAL_COMMUNITY)
Admission: AD | Admit: 2022-04-20 | Discharge: 2022-04-20 | Disposition: A | Discharge status: Home / Self Care | Payer: Medicaid Other | Attending: Obstetrics & Gynecology | Admitting: Obstetrics & Gynecology

## 2022-04-20 DIAGNOSIS — Z3202 Encounter for pregnancy test, result negative: Secondary | ICD-10-CM | POA: Diagnosis not present

## 2022-04-20 DIAGNOSIS — N921 Excessive and frequent menstruation with irregular cycle: Secondary | ICD-10-CM

## 2022-04-20 LAB — URINALYSIS, ROUTINE W REFLEX MICROSCOPIC
Bacteria, UA: NONE SEEN
Bilirubin Urine: NEGATIVE
Glucose, UA: NEGATIVE mg/dL
Ketones, ur: NEGATIVE mg/dL
Nitrite: NEGATIVE
Protein, ur: NEGATIVE mg/dL
RBC / HPF: >50 RBC/hpf (ref 0–5)
Specific Gravity, Urine: 1.019 (ref 1.005–1.030)
pH: 5 (ref 5.0–8.0)

## 2022-04-20 LAB — HEMOGLOBIN AND HEMATOCRIT, BLOOD
HCT: 31.5 % — ABNORMAL LOW (ref 36.0–46.0)
Hemoglobin: 10.1 g/dL — ABNORMAL LOW (ref 12.0–15.0)

## 2022-04-20 LAB — POCT PREGNANCY, URINE: Preg Test, Ur: NEGATIVE

## 2022-04-20 MED ORDER — MEGESTROL ACETATE 40 MG PO TABS: ORAL_TABLET | ORAL | 3 refills | Status: DC

## 2022-04-20 MED ORDER — NIFEREX PO TABS: 1.0000 | ORAL_TABLET | Freq: Every day | ORAL | 5 refills | Status: DC

## 2022-04-20 NOTE — MAU Provider Note (Signed)
Chief Complaint:  Vaginal Bleeding  HPI: Anna Potts is a 27 y.o. G0P0 who presents to maternity admissions reporting heavy bleeding with clots, soaking through at least a large pad/adult diaper per hour x1 week. Her periods are typically irregular with heavy bleeding but usually only last 4-5 days, this is the longest and heaviest of a period she's ever had. Endorses bad cramping (eased by ibuprofen) and occasional dizziness, feeling fatigued. No other physical complaints.  Does not have a regular GYN provider, sees [facility] when needed  Past Medical History: Past Medical History:  Diagnosis Date   Dermatographia    Iron deficiency anemia    Menstrual periods irregular    Past obstetric history: OB History  Gravida Para Term Preterm AB Living  0            SAB IAB Ectopic Multiple Live Births              Past Surgical History: Past Surgical History:  Procedure Laterality Date   FINGER SURGERY     WISDOM TOOTH EXTRACTION     Family History: Family History  Family history unknown: Yes   Social History: Social History   Tobacco Use   Smoking status: Never   Smokeless tobacco: Never  Vaping Use   Vaping Use: Never used  Substance Use Topics   Alcohol use: No   Drug use: Yes    Types: Marijuana   Allergies:  Allergies  Allergen Reactions   Codeine Itching   Grassleaf Sweetflag Rhizome Other (See Comments)    sneezing   Nickel Other (See Comments)    Eats away skin    Meds:  No medications prior to admission.   I have reviewed patient's Past Medical Hx, Surgical Hx, Family Hx, Social Hx, medications and allergies.  ROS:  Pertinent items noted in HPI and remainder of comprehensive ROS otherwise negative.   Physical Exam  Patient Vitals for the past 24 hrs:  BP Temp Pulse Resp Height Weight  04/20/22 1246 118/63 98.3 F (36.8 C) 78 18 '5\' 6"'$  (1.676 m) 245 lb (111.1 kg)   Constitutional: Well-developed, well-nourished female in no acute distress.   Cardiovascular: normal rate and rhythm, warm and well perfused Respiratory: normal effort, no distress GI: Abd soft, non-tender, non-distended MS: Extremities nontender, no edema, normal ROM Neurologic: Alert and oriented x 4 Skin: warm and dry, no lesion or rash noted Psych:  appropriate affect/mood Pelvic: deferred, bleeding heavy but VSS  Labs: Results for orders placed or performed during the hospital encounter of 04/20/22 (from the past 24 hour(s))  Urinalysis, Routine w reflex microscopic -Urine, Clean Catch     Status: Abnormal   Collection Time: 04/20/22 12:39 PM  Result Value Ref Range   Color, Urine YELLOW YELLOW   APPearance HAZY (A) CLEAR   Specific Gravity, Urine 1.019 1.005 - 1.030   pH 5.0 5.0 - 8.0   Glucose, UA NEGATIVE NEGATIVE mg/dL   Hgb urine dipstick LARGE (A) NEGATIVE   Bilirubin Urine NEGATIVE NEGATIVE   Ketones, ur NEGATIVE NEGATIVE mg/dL   Protein, ur NEGATIVE NEGATIVE mg/dL   Nitrite NEGATIVE NEGATIVE   Leukocytes,Ua TRACE (A) NEGATIVE   RBC / HPF >50 0 - 5 RBC/hpf   WBC, UA 0-5 0 - 5 WBC/hpf   Bacteria, UA NONE SEEN NONE SEEN   Squamous Epithelial / HPF 0-5 0 - 5 /HPF   Mucus PRESENT   Pregnancy, urine POC     Status: None   Collection Time:  04/20/22 12:44 PM  Result Value Ref Range   Preg Test, Ur NEGATIVE NEGATIVE  Hemoglobin and hematocrit, blood     Status: Abnormal   Collection Time: 04/20/22  1:19 PM  Result Value Ref Range   Hemoglobin 10.1 (L) 12.0 - 15.0 g/dL   HCT 31.5 (L) 36.0 - 46.0 %   Imaging:  OP Pelvis complete ordered  MDM: Moderate  MAU Course: I have ordered labs as follows: H&H, mildly anemic but not severe range Imaging ordered: outpatient pelvic ultrasound Results reviewed with patient, will order oral iron, megace and follow up in the office  Assessment: 1. Menorrhagia with irregular cycle    Plan: Discharge home with bleeding precautions Follow up at [facility] for ultrasound and provider visit.   Follow-up  [place] [facility]. Schedule an appointment as soon as possible for a visit.   Specialty: Obstetrics and Gynecology Why: with Dr. Okey Dupre or Gaylan Gerold, Certified Nurse Midwife Contact information: [place] [phone]               Gaylan Gerold, MSN, CNM, [facility] 04/20/2022 7:28 PM

## 2022-04-20 NOTE — MAU Note (Signed)
.  Anna Potts is a 27 y.o. at Unknown here in MAU reporting: has been having heavy vaginal bleeding for a week.Passing large clots.changing pad every hour.  Had been spotting for a week prior. Has had irregular period. Felt light headed and dizzy last night. C/o server cramping. LMP: 04/11/22 Onset of complaint: 1 week Pain score: 10 Vitals:   04/20/22 1246  BP: 118/63  Pulse: 78  Resp: 18  Temp: 98.3 F (36.8 C)     FHT:n/a Lab orders placed from triage:  UPT/ U/A

## 2022-04-26 DIAGNOSIS — Z419 Encounter for procedure for purposes other than remedying health state, unspecified: Secondary | ICD-10-CM | POA: Diagnosis not present

## 2022-05-06 DIAGNOSIS — N390 Urinary tract infection, site not specified: Secondary | ICD-10-CM | POA: Diagnosis not present

## 2022-05-27 DIAGNOSIS — Z419 Encounter for procedure for purposes other than remedying health state, unspecified: Secondary | ICD-10-CM | POA: Diagnosis not present

## 2022-06-26 DIAGNOSIS — Z419 Encounter for procedure for purposes other than remedying health state, unspecified: Secondary | ICD-10-CM | POA: Diagnosis not present

## 2022-09-24 ENCOUNTER — Other Ambulatory Visit: Payer: Self-pay

## 2022-09-24 ENCOUNTER — Ambulatory Visit
Admission: EM | Admit: 2022-09-24 | Discharge: 2022-09-24 | Disposition: A | Discharge status: Home / Self Care | Payer: Medicaid Other | Attending: Physician Assistant | Admitting: Physician Assistant

## 2022-09-24 DIAGNOSIS — N938 Other specified abnormal uterine and vaginal bleeding: Secondary | ICD-10-CM

## 2022-09-24 DIAGNOSIS — R0789 Other chest pain: Secondary | ICD-10-CM

## 2022-09-24 DIAGNOSIS — S161XXA Strain of muscle, fascia and tendon at neck level, initial encounter: Secondary | ICD-10-CM | POA: Diagnosis present

## 2022-09-24 DIAGNOSIS — Z113 Encounter for screening for infections with a predominantly sexual mode of transmission: Secondary | ICD-10-CM | POA: Diagnosis present

## 2022-09-24 DIAGNOSIS — R5383 Other fatigue: Secondary | ICD-10-CM | POA: Diagnosis present

## 2022-09-24 MED ORDER — IBUPROFEN 600 MG PO TABS: 600.0000 mg | ORAL_TABLET | Freq: Three times a day (TID) | ORAL | 0 refills | Status: DC | PRN

## 2022-09-24 MED ORDER — CYCLOBENZAPRINE HCL 5 MG PO TABS: 5.0000 mg | ORAL_TABLET | Freq: Three times a day (TID) | ORAL | 0 refills | Status: DC | PRN

## 2022-09-24 NOTE — Discharge Instructions (Addendum)
Work on getting a Paramedic. Try contacting your insurance company or your primary care provider for guidance on how to find one.   You can take the cyclobenzaprine (muscle relaxer) up to 3 times a day but don't take it with hydrozyzine  as they both make you sleepy. If you need to work or drive, don't take the cyclobenzaprine except at bedtime as it can make you sleepy. If your neck is not getting better with gentle stretching and medicine, follow up with sports medicine (see clinic contact info below).   You will get a call if tests are positive, you will not get a call if tests are negative but you can check results in MyChart if you have a MyChart account.   If you do not have a primary care provider, and you want to find one at [facility], go to [email or web address], click on "primary care," click on "new patients: schedule online," and choose an appointment place and time that fits your schedule.
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
## 2022-09-26 ENCOUNTER — Telehealth (HOSPITAL_COMMUNITY): Payer: Self-pay | Admitting: Emergency Medicine

## 2022-09-26 MED ORDER — DOXYCYCLINE HYCLATE 100 MG PO CAPS: 100.0000 mg | ORAL_CAPSULE | Freq: Two times a day (BID) | ORAL | 0 refills | Status: AC

## 2022-09-26 MED ORDER — METRONIDAZOLE 500 MG PO TABS: 500.0000 mg | ORAL_TABLET | Freq: Two times a day (BID) | ORAL | 0 refills | Status: DC
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
## 2023-03-10 ENCOUNTER — Ambulatory Visit
Admission: EM | Admit: 2023-03-10 | Discharge: 2023-03-10 | Disposition: A | Discharge status: Home / Self Care | Payer: Medicaid Other | Attending: Family Medicine | Admitting: Family Medicine

## 2023-03-10 DIAGNOSIS — J029 Acute pharyngitis, unspecified: Secondary | ICD-10-CM

## 2023-03-10 DIAGNOSIS — J069 Acute upper respiratory infection, unspecified: Secondary | ICD-10-CM | POA: Diagnosis not present

## 2023-03-10 LAB — POCT RAPID STREP A (OFFICE): Rapid Strep A Screen: NEGATIVE

## 2023-03-10 LAB — POC COVID19/FLU A&B COMBO
Covid Antigen, POC: NEGATIVE
Influenza A Antigen, POC: NEGATIVE
Influenza B Antigen, POC: NEGATIVE

## 2023-03-10 MED ORDER — PROMETHAZINE-DM 6.25-15 MG/5ML PO SYRP: 5.0000 mL | ORAL_SOLUTION | Freq: Four times a day (QID) | ORAL | 0 refills | Status: DC | PRN

## 2023-03-10 NOTE — ED Provider Notes (Signed)
 [facility]   [id number] 03/10/23 Arrival Time: 1331  ASSESSMENT & PLAN:  1. Viral URI with cough   2. Sore throat    Discussed typical duration of likely viral illness. Results for orders placed or performed during the hospital encounter of 03/10/23  POCT rapid strep A   Collection Time: 03/10/23  2:20 PM  Result Value Ref Range   Rapid Strep A Screen Negative Negative  POC Covid19/Flu A&B Antigen   Collection Time: 03/10/23  2:59 PM  Result Value Ref Range   Influenza A Antigen, POC Negative    Influenza B Antigen, POC Negative    Covid Antigen, POC Negative     OTC symptom care as needed.  New Prescriptions   PROMETHAZINE -DEXTROMETHORPHAN (PROMETHAZINE -DM) 6.25-15 MG/5ML SYRUP    Take 5 mLs by mouth 4 (four) times daily as needed for cough.     Discharge Instructions      You may use over the counter ibuprofen  or acetaminophen as needed.  For a sore throat, over the counter products such as [employer] Peroxyl Mouth Sore Rinse or Chloraseptic Sore Throat Spray may provide some temporary relief.        Follow-up Information     [facility]).   Specialty: Urgent Care Why: If worsening or failing to improve as anticipated. Contact information: [address] [place]  [address] [phone]                Reviewed expectations re: course of current medical issues. Questions answered. Outlined signs and symptoms indicating need for more acute intervention. Understanding verbalized. After Visit Summary given.   SUBJECTIVE: History from: Patient. Anna Potts is a 28 y.o. female. Reports: ST, chills, body aches, mild cough; abrupt onset; x 48 h approx. Denies: fever. Normal PO intake without n/v/d.  OBJECTIVE:  Vitals:   03/10/23 1415 03/10/23 1417  BP:  109/72  Pulse:  88  Resp:  18  Temp:  99.8 F (37.7 C)  TempSrc:  Oral  SpO2:  99%  Weight: 108.9 kg   Height:  5' 6 (1.676 m)     General appearance: alert; no distress Eyes: PERRLA; EOMI; conjunctiva normal HENT: [place]; AT; with nasal congestion; throat with mild cobblestoning Neck: supple  Lungs: speaks full sentences without difficulty; unlabored; CTAB Extremities: no edema Skin: warm and dry Neurologic: normal gait Psychological: alert and cooperative; normal mood and affect  Labs: Results for orders placed or performed during the hospital encounter of 03/10/23  POCT rapid strep A   Collection Time: 03/10/23  2:20 PM  Result Value Ref Range   Rapid Strep A Screen Negative Negative  POC Covid19/Flu A&B Antigen   Collection Time: 03/10/23  2:59 PM  Result Value Ref Range   Influenza A Antigen, POC Negative    Influenza B Antigen, POC Negative    Covid Antigen, POC Negative    Labs Reviewed  POCT RAPID STREP A (OFFICE) - Normal  POC COVID19/FLU A&B COMBO - Normal    Imaging: No results found.  Allergies  Allergen Reactions   Codeine Itching   Grassleaf Sweetflag Rhizome Other (See Comments)    sneezing   Nickel Other (See Comments)    Eats away skin     Past Medical History:  Diagnosis Date   Dermatographia    Iron deficiency anemia    Menstrual periods irregular    Social History   Socioeconomic History   Marital status: Single  Spouse name: Not on file   Number of children: Not on file   Years of education: Not on file   Highest education level: Not on file  Occupational History   Not on file  Tobacco Use   Smoking status: Never   Smokeless tobacco: Never  Vaping Use   Vaping status: Former   Substances: Nicotine, Flavoring  Substance and Sexual Activity   Alcohol use: No   Drug use: Yes    Types: Marijuana   Sexual activity: Yes    Birth control/protection: None  Other Topics Concern   Not on file  Social History Narrative   Not on file   Social Drivers of Health   Financial Resource Strain: Not on file  Food Insecurity: Not on file   Transportation Needs: Not on file  Physical Activity: Not on file  Stress: Not on file  Social Connections: Not on file  Intimate Partner Violence: Not on file   Family History  Family history unknown: Yes   Past Surgical History:  Procedure Laterality Date   FINGER SURGERY     WISDOM TOOTH EXTRACTION       Rolinda Rogue, MD 03/10/23 1726

## 2023-03-10 NOTE — Discharge Instructions (Signed)
 You may use over the counter ibuprofen or acetaminophen as needed.  For a sore throat, over the counter products such as Colgate Peroxyl Mouth Sore Rinse or Chloraseptic Sore Throat Spray may provide some temporary relief.

## 2023-03-10 NOTE — ED Triage Notes (Signed)
 This started Saturday night with sore throat, woke up Sunday and this was over with. Body aches/Fever with chills started yesterday with degree of fever unknown. The sore throat maybe remains a little. No rash. No runny nose. No sob. No wheezing. No cough.

## 2023-10-09 ENCOUNTER — Encounter: Payer: Self-pay | Admitting: *Deleted

## 2023-10-09 ENCOUNTER — Ambulatory Visit: Admission: EM | Admit: 2023-10-09 | Discharge: 2023-10-09 | Disposition: A | Discharge status: Home / Self Care

## 2023-10-09 ENCOUNTER — Other Ambulatory Visit: Payer: Self-pay

## 2023-10-09 DIAGNOSIS — M79675 Pain in left toe(s): Secondary | ICD-10-CM | POA: Diagnosis not present

## 2023-10-09 MED ORDER — PREDNISONE 20 MG PO TABS: 40.0000 mg | ORAL_TABLET | Freq: Every day | ORAL | 0 refills | Status: AC

## 2023-10-09 NOTE — ED Provider Notes (Signed)
 [facility]    CSN: [id number] Arrival date & time: 10/09/23  1206      History   Chief Complaint Chief Complaint  Patient presents with   Toe Pain    HPI Anna Potts is a 28 y.o. female.   Patient here today for evaluation of pain to her left great toe that started yesterday.  She reports that the area is painful to touch now and walking causes pain.  She reports that she might of hurt it at work because she did have some pain develop after she had stepped/jumped down from a small ladder.  She reports she did not have pain initially.  She took ibuprofen  last night with mild relief.  She does not report any numbness or tingling.  The history is provided by the patient.  Toe Pain Pertinent negatives include no abdominal pain and no shortness of breath.    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 Authorizing Provider  Ferrous Sulfate  (IRON SUPPLEMENT PO) Take by mouth.   Yes [provider]  hydrOXYzine  (ATARAX ) 10 MG tablet Take 1-2 tablets as needed every 8 hours for breakthrough hives. 04/19/21  Yes Luke Orlan HERO, DO  predniSONE  (DELTASONE ) 20 MG tablet  Take 2 tablets (40 mg total) by mouth daily with breakfast for 5 days. 10/09/23 10/14/23 Yes Billy Asberry FALCON, PA-C  cyclobenzaprine  (FLEXERIL ) 5 MG tablet Take 1 tablet (5 mg total) by mouth 3 (three) times daily as needed for muscle spasms. Patient not taking: Reported on 10/09/2023 09/24/22   Richad Jon HERO, NP  doxycycline  (VIBRAMYCIN ) 100 MG capsule Take 100 mg by mouth 2 (two) times daily. Patient not taking: Reported on 10/09/2023 10/17/22   [provider]  ibuprofen  (ADVIL ) 600 MG tablet Take 1 tablet (600 mg total) by mouth every 8 (eight) hours as needed. Patient not taking: Reported on 10/09/2023 09/24/22   Richad Jon HERO, NP  Iron Combinations (NIFEREX ) TABS Take 1 tablet by mouth daily after breakfast. Patient not taking: Reported on 10/09/2023 04/20/22   Vannie Cornell SAUNDERS, CNM  megestrol  (MEGACE ) 40 MG tablet Take two tablets three times daily for 3 days, then two tablets twice daily for 3 days, then one tablet twice daily Patient not taking: Reported on 10/09/2023 04/20/22   Walker, Jamilla R, CNM  promethazine -dextromethorphan (PROMETHAZINE -DM) 6.25-15 MG/5ML  syrup Take 5 mLs by mouth 4 (four) times daily as needed for cough. Patient not taking: Reported on 10/09/2023 03/10/23   Rolinda Rogue, MD    Family History Family History  Family history unknown: Yes    Social History Social History   Tobacco Use   Smoking status: Never   Smokeless tobacco: Never  Vaping Use   Vaping status: Every Day   Substances: Nicotine, Flavoring  Substance Use Topics   Alcohol use: No   Drug use: Yes    Types: Marijuana     Allergies   Codeine, Grassleaf sweetflag rhizome, and Nickel   Review of Systems Review of Systems  Constitutional:  Negative for chills and fever.  Eyes:  Negative for discharge and redness.  Respiratory:  Negative for shortness of breath.   Gastrointestinal:  Negative for abdominal pain, nausea and vomiting.  Musculoskeletal:  Positive for arthralgias.   Skin:  Negative for color change.  Neurological:  Negative for numbness.     Physical Exam Triage Vital Signs ED Triage Vitals  Encounter Vitals Group     BP 10/09/23 1223 114/65     Girls Systolic BP Percentile --      Girls Diastolic BP Percentile --      Boys Systolic BP Percentile --      Boys Diastolic BP Percentile --      Pulse Rate 10/09/23 1223 94     Resp 10/09/23 1223 16     Temp 10/09/23 1223 98.5 F (36.9 C)     Temp Source 10/09/23 1223 Oral     SpO2 10/09/23 1223 98 %     Weight --      Height --      Head Circumference --      Peak Flow --      Pain Score 10/09/23 1219 8     Pain Loc --      Pain Education --      Exclude from Growth Chart --    No data found.  Updated Vital Signs BP 114/65 (BP Location: Left Arm)   Pulse 94   Temp 98.5 F (36.9 C) (Oral)   Resp 16   LMP 08/09/2023   SpO2 98%   Visual Acuity Right Eye Distance:   Left Eye Distance:   Bilateral Distance:    Right Eye Near:   Left Eye Near:    Bilateral Near:     Physical Exam Vitals and nursing note reviewed.  Constitutional:      General: She is not in acute distress.    Appearance: Normal appearance. She is not ill-appearing.  HENT:     Head: Normocephalic and atraumatic.  Eyes:     Conjunctiva/sclera: Conjunctivae normal.  Cardiovascular:     Rate and Rhythm: Normal rate.  Pulmonary:     Effort: Pulmonary effort is normal. No respiratory distress.  Musculoskeletal:     Comments: Mildly decreased range of motion of the left great toe due to pain at first MTP.  Tenderness noted to the same area.  No erythema or swelling noted.  Skin:    Capillary Refill: Normal cap refill to the left great toe Neurological:     Mental Status: She is alert.     Comments: Gross sensation intact to distal left great toe  Psychiatric:        Mood and Affect: Mood normal.        Behavior: Behavior normal.        Thought Content:  Thought content normal.      UC Treatments /  Results  Labs (all labs ordered are listed, but only abnormal results are displayed) Labs Reviewed - No data to display  EKG   Radiology No results found.  Procedures Procedures (including critical care time)  Medications Ordered in UC Medications - No data to display  Initial Impression / Assessment and Plan / UC Course  I have reviewed the triage vital signs and the nursing notes.  Pertinent labs & imaging results that were available during my care of the patient were reviewed by me and considered in my medical decision making (see chart for details).    Will trial steroid burst for suspected inflammatory cause of pain.  Low suspicion for fracture given no injury and recommended she follow-up with ortho if she does not have improvement with treatment or with any worsening.  Postop shoe provided in office for extra support.  Final Clinical Impressions(s) / UC Diagnoses   Final diagnoses:  Great toe pain, left   Discharge Instructions   None    ED Prescriptions     Medication Sig Dispense Auth. Provider   predniSONE  (DELTASONE ) 20 MG tablet Take 2 tablets (40 mg total) by mouth daily with breakfast for 5 days. 10 tablet Billy Asberry FALCON, PA-C      PDMP not reviewed this encounter.   Billy Asberry FALCON, PA-C 10/09/23 1710

## 2023-10-09 NOTE — ED Triage Notes (Signed)
 Pt reports left great toe pain since yesterday. States pain is getting worse and is painful to touch. The pain kept her up last night. States she may have hurt it at work but she isn't certain. She took ibuprofen  last night with minimal relief and took some today.

## 2024-02-16 ENCOUNTER — Ambulatory Visit
Admission: EM | Admit: 2024-02-16 | Discharge: 2024-02-16 | Disposition: A | Discharge status: Home / Self Care | Payer: Self-pay | Attending: Family Medicine | Admitting: Family Medicine

## 2024-02-16 ENCOUNTER — Other Ambulatory Visit: Payer: Self-pay

## 2024-02-16 VITALS — BP 106/70 | HR 88 | Temp 99.2°F | Resp 16

## 2024-02-16 DIAGNOSIS — K529 Noninfective gastroenteritis and colitis, unspecified: Secondary | ICD-10-CM

## 2024-02-16 HISTORY — DX: Anxiety disorder, unspecified: F41.9

## 2024-02-16 HISTORY — DX: Depression, unspecified: F32.A

## 2024-02-16 HISTORY — DX: Urticaria, unspecified: L50.9

## 2024-02-16 MED ORDER — ONDANSETRON HCL 8 MG PO TABS: 8.0000 mg | ORAL_TABLET | Freq: Three times a day (TID) | ORAL | 0 refills | Status: AC | PRN

## 2024-02-16 NOTE — ED Triage Notes (Signed)
 Woke up this morning with vomiting and diarrhea. No nausea right now. Reports but my stomach has been bubbling. Reports she has not eaten but feels hungry right now. Denies feeling like she's had a fever. Has been sleeping a lot today. No otc meds.

## 2024-02-16 NOTE — ED Provider Notes (Signed)
 " [facility]    CSN: [id number] Arrival date & time: 02/16/24  1609      History   Chief Complaint Chief Complaint  Patient presents with   Emesis   Diarrhea    HPI Anna Potts is a 28 y.o. female.   Patient states she has had nausea vomiting and diarrhea since this morning.  She tried to go to work but was unable to.  States the diarrhea has been uncontrollable at times.  No fevers or chills.  No body aches.    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 Authorizing Provider  ondansetron  (ZOFRAN ) 8 MG tablet Take 1 tablet (8 mg total) by mouth every 8 (eight) hours as needed for nausea or vomiting. 02/16/24  Yes Maranda Jamee Jacob, MD  hydrOXYzine  (ATARAX ) 10 MG tablet Take 1-2 tablets as needed every 8 hours for breakthrough hives. 04/19/21   Luke Orlan HERO, DO    Family History Family History  Family history unknown: Yes    Social History Social History[1]   Allergies   Codeine, Grassleaf sweetflag rhizome, and Nickel   Review of Systems Review of Systems See HPI  Physical Exam Triage Vital Signs ED Triage Vitals   Encounter Vitals Group     BP 02/16/24 1626 106/70     Girls Systolic BP Percentile --      Girls Diastolic BP Percentile --      Boys Systolic BP Percentile --      Boys Diastolic BP Percentile --      Pulse Rate 02/16/24 1626 88     Resp 02/16/24 1626 16     Temp 02/16/24 1626 99.2 F (37.3 C)     Temp src --      SpO2 02/16/24 1626 98 %     Weight --      Height --      Head Circumference --      Peak Flow --      Pain Score 02/16/24 1629 0     Pain Loc --      Pain Education --      Exclude from Growth Chart --    No data found.  Updated Vital Signs BP 106/70   Pulse 88   Temp 99.2  F (37.3 C)   Resp 16   LMP 02/02/2024   SpO2 98%       Physical Exam Constitutional:      General: She is not in acute distress.    Appearance: She is well-developed.  HENT:     Head: Normocephalic and atraumatic.  Eyes:     Conjunctiva/sclera: Conjunctivae normal.     Pupils: Pupils are equal, round, and reactive to light.  Cardiovascular:     Rate and Rhythm: Normal rate.  Pulmonary:     Effort: Pulmonary effort is normal. No respiratory distress.  Abdominal:     General: Bowel sounds are normal. There is no distension.     Palpations: Abdomen is soft.     Tenderness: There is no abdominal tenderness.  Musculoskeletal:        General: Normal range of motion.     Cervical back: Normal range of motion.  Skin:    General: Skin is warm and dry.  Neurological:     Mental Status: She is alert.      UC Treatments / Results  Labs (all labs ordered are listed, but only abnormal results are displayed) Labs Reviewed - No data to display  EKG   Radiology No results found.  Procedures Procedures (including critical care time)  Medications Ordered in UC Medications - No data to display  Initial Impression / Assessment and Plan / UC Course  I have reviewed the triage vital signs and the nursing notes.  Pertinent labs & imaging results that were available during my  care of the patient were reviewed by me and considered in my medical decision making (see chart for details).     Final Clinical Impressions(s) / UC Diagnoses   Final diagnoses:  Gastroenteritis     Discharge Instructions      Take Zofran  to stop the vomiting Make sure you are drinking enough fluids to prevent dehydration Electrolyte replacement fluids like Gatorade or Pedialyte are a good idea When the vomiting is stopped you can start bland diet    ED Prescriptions     Medication Sig Dispense Auth. Provider   ondansetron  (ZOFRAN ) 8 MG tablet Take 1 tablet (8 mg total) by mouth every 8 (eight) hours as needed for nausea or vomiting. 20 tablet Maranda Jamee Jacob, MD      PDMP not reviewed this encounter.    [1]  Social History Tobacco Use   Smoking status: Never   Smokeless tobacco: Never  Vaping Use   Vaping status: Every Day   Substances: Nicotine, Flavoring  Substance Use Topics   Alcohol use: Never   Drug use: Yes    Types: Marijuana     Maranda Jamee Jacob, MD 02/16/24 1710  "

## 2024-02-16 NOTE — Discharge Instructions (Signed)
 Take Zofran  to stop the vomiting Make sure you are drinking enough fluids to prevent dehydration Electrolyte replacement fluids like Gatorade or Pedialyte are a good idea When the vomiting is stopped you can start bland diet
